# Patient Record
Sex: Female | Born: 1950 | Race: White | Hispanic: No | Marital: Married | Smoking: Current every day smoker
Health system: Southern US, Community
[De-identification: ages and names within clinical notes are randomized; demographics above are authoritative.]

## PROBLEM LIST (undated history)

## (undated) DIAGNOSIS — I10 Essential (primary) hypertension: Secondary | ICD-10-CM

## (undated) DIAGNOSIS — G629 Polyneuropathy, unspecified: Secondary | ICD-10-CM

## (undated) DIAGNOSIS — F1011 Alcohol abuse, in remission: Secondary | ICD-10-CM

## (undated) DIAGNOSIS — J189 Pneumonia, unspecified organism: Secondary | ICD-10-CM

## (undated) DIAGNOSIS — G2581 Restless legs syndrome: Secondary | ICD-10-CM

## (undated) HISTORY — PX: ABDOMINAL HYSTERECTOMY: SHX81

---

## 1997-12-31 ENCOUNTER — Ambulatory Visit (HOSPITAL_COMMUNITY): Admission: RE | Admit: 1997-12-31 | Discharge: 1997-12-31 | Payer: Self-pay | Admitting: Family Medicine

## 1999-09-14 ENCOUNTER — Emergency Department (HOSPITAL_COMMUNITY): Admission: EM | Admit: 1999-09-14 | Discharge: 1999-09-14 | Payer: Self-pay | Admitting: Emergency Medicine

## 1999-09-14 ENCOUNTER — Encounter: Payer: Self-pay | Admitting: Internal Medicine

## 1999-09-20 ENCOUNTER — Ambulatory Visit (HOSPITAL_COMMUNITY): Admission: RE | Admit: 1999-09-20 | Discharge: 1999-09-20 | Payer: Self-pay | Admitting: Orthopedic Surgery

## 1999-09-20 ENCOUNTER — Encounter: Payer: Self-pay | Admitting: Orthopedic Surgery

## 2005-08-31 ENCOUNTER — Ambulatory Visit: Payer: Self-pay | Admitting: Pulmonary Disease

## 2005-08-31 ENCOUNTER — Inpatient Hospital Stay (HOSPITAL_COMMUNITY): Admission: EM | Admit: 2005-08-31 | Discharge: 2005-09-15 | Payer: Self-pay | Admitting: Emergency Medicine

## 2005-09-01 ENCOUNTER — Encounter (INDEPENDENT_AMBULATORY_CARE_PROVIDER_SITE_OTHER): Payer: Self-pay | Admitting: Cardiology

## 2005-09-04 ENCOUNTER — Encounter (INDEPENDENT_AMBULATORY_CARE_PROVIDER_SITE_OTHER): Payer: Self-pay | Admitting: Cardiology

## 2005-09-06 ENCOUNTER — Encounter (INDEPENDENT_AMBULATORY_CARE_PROVIDER_SITE_OTHER): Payer: Self-pay | Admitting: Cardiology

## 2009-07-30 ENCOUNTER — Encounter: Admission: RE | Admit: 2009-07-30 | Discharge: 2009-07-30 | Payer: Self-pay | Admitting: Family Medicine

## 2009-09-01 ENCOUNTER — Ambulatory Visit: Payer: Self-pay | Admitting: Vascular Surgery

## 2009-12-22 ENCOUNTER — Encounter: Admission: RE | Admit: 2009-12-22 | Discharge: 2010-01-21 | Payer: Self-pay | Admitting: Family Medicine

## 2010-04-13 ENCOUNTER — Ambulatory Visit: Payer: Self-pay | Admitting: Vascular Surgery

## 2010-09-06 NOTE — Assessment & Plan Note (Signed)
OFFICE VISIT   SYLVA, OVERLEY  DOB:  October 06, 1950                                       09/01/2009  CHART#:[id number]   The patient is a 60 year old female referred by Dr. Docia Chuck for  evaluation of asymptomatic carotid stenosis.  The patient was noted to  have an asymptomatic bruit on recent physical exam and was sent for a  carotid duplex scan.  I reviewed her carotid duplex scan today.  This  was done at [facility] on 07/30/2009.  This showed a 60%-80%  right internal carotid artery stenosis and a 40%-60% left internal  carotid artery stenosis.  She had antegrade vertebral flow bilaterally.  She denies any prior symptoms of TIA, amaurosis or stroke.   CHRONIC MEDICAL PROBLEMS:  Include hypertension, elevated cholesterol,  COPD, which are all followed by her primary care doctor and currently  controlled.  She currently smokes one pack of cigarettes per day and  greater than 3 minutes today were spent regarding smoking cessation  counseling.  Past medical history is also significant for peripheral  neuropathy.   PAST SURGICAL HISTORY:  She has had tonsillectomy, right knee operation,  eye operation and hysterectomy.   FAMILY HISTORY:  Her mother had vascular disease at age less than 37.   SOCIAL HISTORY:  She is single.  She is currently on Social Security  disability.  Smoking history as listed above.  She does not consume  alcohol regularly.   REVIEW OF SYSTEMS:  A full 12 point review of systems was performed with  the patient today.  Please see intake referral form for details  regarding this.   CURRENT MEDICATIONS:  1. Include gabapentin 600 mg 2 tablets three times a day.  2. Cymbalta 60 mg once a day.  3. Bupropion 550 mg twice a day.  4. Simvastatin 20 mg once a day.  5. Singulair once in the evening.  6. Premarin 0.45 once a day.  7. Diovan/HCT once a day.   ALLERGIES:  She has allergies listed to codeine and Darvon both of  which  cause nausea and vomiting.   PHYSICAL EXAM:  Vital signs:  Blood pressure is 109/65 in the left arm,  95/62 in the right arm, heart rate is 85 and regular, respirations 16.  HEENT:  Unremarkable.  Neck:  Has 2+ carotid pulses with a soft  bilateral carotid bruit.  Chest:  Clear to auscultation.  Cardiac:  Regular rate and rhythm without murmur.  Abdomen:  Soft, nontender,  nondistended.  No masses.  Extremities:  She has absent brachial and  radial pulse on the right side.  She has a 1+ left brachial pulse with  absent left radial pulse.  She has a 2+ right femoral pulse.  She has a  barely palpable left femoral pulse.  She has a 1+ dorsalis pedis pulse  on the right.  She has absent popliteal and pedal pulses on the left.  Feet are pink, warm and well-perfused bilaterally.  Skin:  Has no open  ulcers or rashes.  Neurologic:  Exam shows no focal weakness in the  upper or lower extremities.  She does have increased hyper sensation in  both feet secondary to her neuropathy.   In summary, the patient has an asymptomatic probably 70% right internal  carotid artery stenosis.  I believe the best  option for this currently  is smoking cessation as well as starting one aspirin daily and including  continued monitoring of her other atherosclerotic risk factors of  elevated cholesterol and hypertension.  She will return for a carotid  duplex scan in six months' time or sooner if she develops symptoms  related to her carotid stenosis.  She most likely also has occlusive  disease of her lower extremities but this is currently asymptomatic.  Will continue to follow this for now.  She probably also has some  subclavian artery occlusive disease and I will make sure that blood  pressure is measured in her left arm since the pressure is higher on  that side.  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     Janetta Hora.  Fields, MD  Electronically Signed   CEF/MEDQ  D:  09/01/2009  T:  09/02/2009  Job:  [id number]   cc:   Darrow Bussing, MD

## 2010-09-09 NOTE — H&P (Signed)
NAME:  Taylor Fletcher, Taylor Fletcher             ACCOUNT NO.:  [id number]   MEDICAL RECORD NO.:  [id number]          PATIENT TYPE:  EMS   LOCATION:  ED                           FACILITY:  [facility]   PHYSICIAN:  Deirdre Peer. Polite, M.D. DATE OF BIRTH:  [birth year]   DATE OF ADMISSION:  08/31/2005  DATE OF DISCHARGE:                                HISTORY & PHYSICAL   CHIEF COMPLAINT:  Nausea, vomiting, diarrhea.   HISTORY OF PRESENT ILLNESS:  This 60 year old female has a known history of  hypertension, EtOH use to excess and tobacco abuse.  She presents to the ED  after having a 7-8 day history of nausea, vomiting, and diarrhea.  The  patient states that she has been feeling progressively weak without energy  and unable to keep anything down.  Because of those symptoms she presented  to the ED.  In the ED she was initially evaluated, found to be afebrile and  hypotensive with a BP of 62/28, pulse 89, respiratory rate of 16.  Labs were  ordered which showed hyponatremia, hypokalemia, mild elevated LFTs,  leukocytosis of 13,000; and a mildly elevated lipase at 69.  Hospice was  called for further evaluation.  In the interim the patient was given IV  fluids.  At the time of my arrival the patient was found to be very  lethargic and requesting of go home.  She denied any symptoms above, nausea,  vomiting, diarrhea, denied any bleeding, denied any sick contacts, denied  any well water, denied any recent antibiotics.  States no new medications.  The patient states that she had not drank alcohol in a couple of weeks  because of the above symptoms.  The patient denied anything necessary for  further evaluation and treatment of hypotension.   MEDICATIONS ON ADMISSION:  1.  Tylenol p.r.n.  2.  Atenolol.  3.  Xanax.   SOCIAL HISTORY:  Positive for tobacco 1 pack a day.  Positive for alcohol  states that she has not had any over the last couple of weeks, but typically  drank up to 6 beers a day.  Denies any  drugs.   ALLERGIES:  Allergic to DARVOCET.   PAST SURGICAL HISTORY:  Admits to having a hysterectomy.   FAMILY HISTORY:  Father deceased of unknown primary.  Mother with  cardiomyopathy.   REVIEW OF SYSTEMS:  As stated in the HPI.   PHYSICAL EXAMINATION:  GENERAL:  The patient is __________.  HEENT:  Moist oral mucosa, no nodes.  No JVD.  CHEST:  Moderate air movement, anterior.  CARDIOVASCULAR:  Regular without S3.  ABDOMEN:  Soft, nontender.  EXTREMITIES:  2+ pulse, the patient does have petechiae over the lower  extremities.  NEUROLOGIC:  Other than being somewhat groggy and lethargic essentially  nonfocal.   LABORATORY DATA:  CMET sodium 123, potassium 3.1, chloride 90, and lipase  69.  CBC:  White count 13.6, hemoglobin 14.3, hematocrit 40, MCV 96.8,  platelet 123.  Neutrophil count 96%.  INR 1.2.   ASSESSMENT:  1.  Hypotension following diarrhea.  2.  History of EtOH use to excess,  according to the patient none x2 weeks.  3.  History of hypertension.  4.  Anxiety.  5.  Fibroid uterus.  6.  Tobacco use, 1 pack per day.  7.  Leukocytosis.  8.  Petechia of the lower extremities.  9.  Hyponatremia.  10. Acute renal failure.  11. Mildly elevated liver function tests.  12. Hypokalemia.   RECOMMENDATIONS:  The patient be admitted to intensive care and for further  evaluation of Cipro and Flagyl for presumed GI source.  We will obtain a  urine drug screen as well as an alcohol level.  With the patient's history  of EtOH use to excess will also provide multivitamin, thiamin, and folate.  We will check a baseline cortisol level.  We will replete her potassium and  obtain a serum magnesium.  The patient will have significant followup labs,  i.e. BMET, and a CBC.  It is expected that the patient will have probably a  significant drop in her postsignificant fluid resuscitation; and will order  the necessary studies.  As for the patient's renal failure probably it is  prerenal  in etiology, further studies pending followup BMET.      Deirdre Peer. Polite, M.D.  Electronically Signed     RDP/MEDQ  D:  08/31/2005  T:  08/31/2005  Job:  [id number]

## 2010-09-09 NOTE — H&P (Signed)
NAME:  Taylor Fletcher, Taylor Fletcher             ACCOUNT NO.:  [id number]   MEDICAL RECORD NO.:  [id number]          PATIENT TYPE:  INP   LOCATION:  1603                         FACILITY:  [facility]   PHYSICIAN:  Deirdre Peer. Polite, M.D. DATE OF BIRTH:  [birth year]   DATE OF ADMISSION:  08/31/2005  DATE OF DISCHARGE:                                HISTORY & PHYSICAL   PRIMARY CARE PHYSICIAN:  Schuyler Amor, M.D.   DISCHARGE DIAGNOSES:  1.  Status post ventilator-dependent respiratory failure for right upper      lobe pneumonia, questionable early fibroproliferative acute respiratory      distress syndrome.  Antibiotic course completed.  Currently on a steroid      wean.  2.  Hypotension secondary to sepsis, resolved.  3.  Ischemic left hand, second digit, rule out vascular anomaly.  Please      note, all studies for vasculitis have been negative.  Patient requires      vascular surgeon evaluation; however, the patient is leaving AMA.  4.  Psychiatric disorder, probable depression.  Patient has been cleared by      psychiatry and states that she has informed consent.  Request has been      made, as the patient is still felt to be high risk for self neglect      secondary to polysubstance abuse, i.e. alcohol and tobacco.  5.  Acute renal failure, resolved.  6.  Bilateral pleural effusion.  7.  Small pericardial effusion, no tamponade.  Seen in consultation by      cardiology.  A 2D echo with preserved EF.  8.  Hypokalemia, resolved.  9.  Intertriginous rash.  10. Diarrhea, resolved.  Clostridium difficile negative x2.  11. History of alcohol abuse.  Patient has been provided information on AA.  12. Partial small bowel obstruction, resolved.   DISCHARGE MEDICATIONS:  1.  Zantac 150 mg b.i.d.  2.  Prednisone 40 mg daily x3 days, then 30 mg daily x3 days, then 20 mg      daily x3 days, then 10 mg daily x3 days, then stop.  3.  Lopressor 25 mg p.o. b.i.d.  4.  Xanax p.r.n.  5.  Combivent 2 puffs  q.6h.  6.  KCL 40 mEq daily.  7.  Lasix 20 mg daily.   Patient is asked not to use alcohol or smoke.   Patient is currently leaving against medical advice.  Patient is asked to  follow up with primary MD.  Essentially needs vascular evaluation for  ischemic-appearing digit on the left hand, second digit.  Please note the  patient has been seen by psychiatry prior to discharge and states that the  patient does have informed consent.   STUDIES:  C. diff negative x2.  Blood culture negative x4.  Sputum culture,  a few yeast.  Urine culture negative.  Urine Legionella antigen negative.   Chest x-ray on May 10th, no apparent disease.  Chest x-ray on May 11th, no  apparent disease.  Chest x-ray on May 12th, right upper lobe pneumonia.   V/Q scan indeterminate for PE.  Chest x-ray on May 13th, increased right upper lobe infiltrate.   CT of the abdomen on May 13th, dilated small bowel loops, mid aspect of the  abdomen.  Etiology undetermined.  Question if this was maybe adhesion  causing partial small bowel obstruction.  Followup will be necessary to  evaluate this finding.   CT of the pelvis, free fluid within the pelvis.  This may be related to  small bowel abnormality.  Patient may be at risk for ischemic bowel.  Clinical correlation and close followup recommended.   Chest x-ray on the 15th showed __________ tip in good position.  No  pneumothorax.  Chest x-ray on the 16th, persistent upper lobe opacities  bilaterally.   Abdominal series on the 17th, no suggestion of small bowel obstruction.   Chest x-ray on the 18th, progressive volume loss, left base.  Chest x-ray on  the 19th, integral increasing effusion and mild edema, increased left  basilar atelectasis/consolidation.  Chest x-ray on the 21st, slight interval  improvement in left basilar aeration.  Chest x-ray on the 23rd, integral  __________ removal of NG tube, increasing interstitial pulmonary edema,  right PICC line  tip __________.   ANA, ANCA, rheumatoid factor, all negative.  Sed rate elevated.  TSH 0.8,  cortisol level 47.9.  Urine drug screen positive for benzodiazepines.  Lactic acid 2.3.   Echo:  Vigorous LV function, positive pericardial effusion.   Albumin 1.9.  CBC at discharge:  White count 9.1, hemoglobin 10.6, platelets  359, sed rate 61.  BMET within normal limits except for a potassium of 3.  This was on May 23.  Follow-up BMET on May 24th, 3.2.  Follow-up BMET on May  25th, potassium 3.8.   A 2D echo:  Overall LV systolic function was normal.  LVEF had estimated  range of 55-60% with diagnostic evidence of LV regional wall motion  abnormality.  There were mild fibrocalcific changes of the aortic root.  The  right ventricular was mildly dilated.  Estimated peak right ventricular  systolic pressure was mildly increased.  There was a branch of a catheter or  a pacer wire in the right ventricle.  The right atrium was mildly dilated.  Inferior vena cava was mildly dilated.  There appeared to be a small, fibrin-  stranded pericardial effusion circumferential to the heart.  Infusion  dimensions 5 mm anterior, 5 mm posterior with no significant change from  previous study.   HISTORY OF PRESENT ILLNESS:  This is a 60 year old female who presented to  the ED for evaluation.  The patient had been having profuse nausea,  vomiting, and diarrhea.  In the ED, the patient was evaluated and found to  be hypotensive.  Labs showing leukocytosis.  [facility] were  consulted for further evaluation.  At the time of my evaluation, the patient  was felt to be probably obstructed from a GI source, and admission was  deemed necessary for further evaluation and treatment.  Please see dictated  H&P for further details.   PAST MEDICAL HISTORY:  Only significant for hypertension and daily alcohol  use as well as tobacco use.  MEDICATIONS ON ADMISSION:  Atenolol and Xanax.   SOCIAL HISTORY:   Positive for alcohol.  Positive for tobacco.  No drugs.   ALLERGIES:  Reported allergy to DARVOCET.   PAST SURGICAL HISTORY:  Admits to having a hysterectomy.   FAMILY HISTORY:  Noncontributory.   HOSPITAL COURSE:  1.  Patient was admitted to ICU setting  for further evaluation and treatment      of her sepsis presentation.  Please note the patient on admission      described having nausea, vomiting, and diarrhea, which essentially      resolved while in the hospital.  The patient was started on empiric      antibiotics from a potential GI source, which included Cipro and Flagyl.      The patient was adequately volume-resuscitated.  Despite the above      measures, the patient continued to remain hypotensive.  Differential      diagnosis was broadened.  Concern for probable cardiac cause was      entertained.  Patient was continued with aggressive fluid resuscitation.      Pressors were ordered.  A 2D echo was ordered.  Several consultants were      seen as well.  Patient saw her cardiologist and ultimately, pulmonary      critical care medicine.  From a cardiac standpoint, it was felt that the      patient's effusion was not significant enough to be causing her      hypotension; however, the patient continued to have follow-up echos if      this remained a concern in the back of her mind.  The patient's follow-      up chest x-rays ultimately showed a right upper lobe pneumonia and from      that point, the patient progressed to vent-dependent respiratory      failure.  Patient was intubated electively, as it was seen that she was      tiring out.  Patient was on the pulmonary critical care from May 13      until May 24.  After intubation, the patient had several central lines,      Swan-Ganz catheters.  The patient was placed on a sepsis protocol and      treated with Xigris.  The patient continued with pressor support,      dopamine, Levophed, which was ultimately weaned.  It still  remained a      conundrum the duration of her hospitalization why her hypotension      persisted.  More than likely, it was multifactorial secondary to      decreased volume, may be contributory secondary to the pleural effusion,      +/- sepsis secondary to the pneumonia.  However, the overall impression      of the critical care doctors were perplexing as to why she continued to      have hypotension, as her CVP obviously was elevated.  Patient's      antibiotics were streamlined.  Flagyl was DC'd.  Vancomycin was DC'd.      Zosyn was continued.  Ultimately, the patient's x-rays have shown some      component of ARDS, and there was concern for early fibroproliferative      ARDS, and Dr. Sherene Sires started the patient on Solu-Medrol, which was      ultimately weaned to oral prednisone.  The patient was extubated on May      22 and ultimately transferred to a hospitalist service.  By that time,     the patient had completed her antibiotics and still was on oral steroid      taper.  Immediately after being extubated, the patient began to state      that she needed to leave the hospital, despite knowing that she was  sick.  Of note, the patient had to be committed to the hospital two days      after admission because she stated that she wanted to leave.  At that      time, she was hypotensive and on pressors; therefore, it was felt      prudent to have psychiatry to see the patient again.  On May 25, patient      was seen by Dr. Jeanie Sewer.  It was stated that the patient had informed      consent; however, despite being cleared by psych, I still feel that the      patient is too sick to be leaving the hospital.  She has several      unresolved issues; therefore, she is being asked to sign an AMA form.  2.  As for the patient's pneumonia, she is to complete her antibiotic course      and will continue nebs as well as steroid taper.  3.  Acute renal failure:  Completely resolved secondary to  decreased volume.  4.  Gastroenteritis, resolved.  5.  Partial small bowel obstruction, resolved.  6.  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  All studies for      vasculitis have been negative to date.  With the patients, smoking      history, she may have some vascular problem as a result of that.  It is      felt that the patient still needs to follow up with a vascular surgeon      on an outpatient basis; however, as the patient is leaving AMA, this      cannot be arranged.  7.  Hypotension, resolved.  Currently, the patient is being placed back on      her antihypertensive medications.  8.  Intertriginous rash:  Patient will continue to require nystatin powder      in the groin area to complete resolution.  9.  Ethanol abuse:  Recommended to patient to seek outside assistance.      Again, the patient is leaving AMA; therefore, this cannot be arranged.   Patient has several other chronic medical problems and again, patient is  leaving against medical advice.  The main issue that needs to be evaluated  at this time is the patient's ischemic digit, second digit on the left hand.  The tip is fairly necrotic and ischemic in appearance.      Deirdre Peer. Polite, M.D.  Electronically Signed     RDP/MEDQ  D:  09/15/2005  T:  09/15/2005  Job:  [id number]   cc:   Schuyler Amor, M.D.  Fax: [phone]

## 2010-10-31 ENCOUNTER — Observation Stay (HOSPITAL_COMMUNITY)
Admission: EM | Admit: 2010-10-31 | Discharge: 2010-11-01 | DRG: 206 | Discharge status: Against Medical Advice | Payer: Medicaid Other | Attending: Internal Medicine | Admitting: Internal Medicine

## 2010-10-31 ENCOUNTER — Emergency Department (HOSPITAL_COMMUNITY): Payer: Medicaid Other

## 2010-10-31 DIAGNOSIS — R079 Chest pain, unspecified: Principal | ICD-10-CM | POA: Insufficient documentation

## 2010-10-31 DIAGNOSIS — D72829 Elevated white blood cell count, unspecified: Secondary | ICD-10-CM | POA: Insufficient documentation

## 2010-10-31 DIAGNOSIS — F17200 Nicotine dependence, unspecified, uncomplicated: Secondary | ICD-10-CM | POA: Insufficient documentation

## 2010-10-31 DIAGNOSIS — Z90710 Acquired absence of both cervix and uterus: Secondary | ICD-10-CM | POA: Insufficient documentation

## 2010-10-31 DIAGNOSIS — I10 Essential (primary) hypertension: Secondary | ICD-10-CM | POA: Insufficient documentation

## 2010-10-31 DIAGNOSIS — Z9089 Acquired absence of other organs: Secondary | ICD-10-CM | POA: Insufficient documentation

## 2010-10-31 DIAGNOSIS — R110 Nausea: Secondary | ICD-10-CM | POA: Insufficient documentation

## 2010-10-31 DIAGNOSIS — R109 Unspecified abdominal pain: Secondary | ICD-10-CM | POA: Insufficient documentation

## 2010-10-31 LAB — LIPASE, BLOOD: Lipase: 20 U/L (ref 11–59)

## 2010-10-31 LAB — DIFFERENTIAL
Basophils Absolute: 0 10*3/uL (ref 0.0–0.1)
Basophils Relative: 0 % (ref 0–1)
Monocytes Absolute: 0.5 10*3/uL (ref 0.1–1.0)
Neutro Abs: 14.3 10*3/uL — ABNORMAL HIGH (ref 1.7–7.7)

## 2010-10-31 LAB — URINALYSIS, ROUTINE W REFLEX MICROSCOPIC
Nitrite: NEGATIVE
Protein, ur: >300 mg/dL — AB
Urobilinogen, UA: 0.2 mg/dL (ref 0.0–1.0)

## 2010-10-31 LAB — CBC
Hemoglobin: 13.6 g/dL (ref 12.0–15.0)
MCHC: 33.7 g/dL (ref 30.0–36.0)
RDW: 13.7 % (ref 11.5–15.5)
WBC: 16.5 10*3/uL — ABNORMAL HIGH (ref 4.0–10.5)

## 2010-10-31 LAB — COMPREHENSIVE METABOLIC PANEL
ALT: 10 U/L (ref 0–35)
Albumin: 3.7 g/dL (ref 3.5–5.2)
Alkaline Phosphatase: 64 U/L (ref 39–117)
Chloride: 100 mEq/L (ref 96–112)
Glucose, Bld: 130 mg/dL — ABNORMAL HIGH (ref 70–99)
Potassium: 3.6 mEq/L (ref 3.5–5.1)
Sodium: 133 mEq/L — ABNORMAL LOW (ref 135–145)
Total Protein: 7.2 g/dL (ref 6.0–8.3)

## 2010-10-31 LAB — TROPONIN I: Troponin I: <0.3 ng/mL (ref <0.30)

## 2010-10-31 LAB — CK TOTAL AND CKMB (NOT AT ARMC)
CK, MB: 2.5 ng/mL (ref 0.3–4.0)
Relative Index: INVALID (ref 0.0–2.5)

## 2010-10-31 LAB — URINE MICROSCOPIC-ADD ON

## 2010-10-31 MED ORDER — IOHEXOL 300 MG/ML  SOLN
100.0000 mL | Freq: Once | INTRAMUSCULAR | Status: AC | PRN
  Administered 2010-10-31: 100 mL via INTRAVENOUS

## 2010-11-01 LAB — CARDIAC PANEL(CRET KIN+CKTOT+MB+TROPI)
CK, MB: 2.4 ng/mL (ref 0.3–4.0)
Relative Index: INVALID (ref 0.0–2.5)
Total CK: 60 U/L (ref 7–177)
Total CK: 59 U/L (ref 7–177)

## 2010-11-01 LAB — COMPREHENSIVE METABOLIC PANEL
Alkaline Phosphatase: 64 U/L (ref 39–117)
BUN: 16 mg/dL (ref 6–23)
GFR calc Af Amer: >60 mL/min (ref >60)
Glucose, Bld: 98 mg/dL (ref 70–99)
Potassium: 3.5 mEq/L (ref 3.5–5.1)
Total Protein: 7.1 g/dL (ref 6.0–8.3)

## 2010-11-01 LAB — DIFFERENTIAL
Basophils Absolute: 0 10*3/uL (ref 0.0–0.1)
Basophils Relative: 0 % (ref 0–1)
Eosinophils Absolute: 0 10*3/uL (ref 0.0–0.7)
Eosinophils Relative: 0 % (ref 0–5)
Monocytes Absolute: 1.7 10*3/uL — ABNORMAL HIGH (ref 0.1–1.0)

## 2010-11-01 LAB — CBC
MCHC: 33.8 g/dL (ref 30.0–36.0)
RDW: 13.8 % (ref 11.5–15.5)

## 2010-11-02 LAB — URINE CULTURE
Colony Count: NO GROWTH
Culture: NO GROWTH

## 2010-11-03 NOTE — Discharge Summary (Signed)
  NAMEJACARRA, Taylor Fletcher             ACCOUNT NO.:  [id number]  MEDICAL RECORD NO.:  [id number]  LOCATION:  1432                         FACILITY:  [facility]  PHYSICIAN:  Conley Canal, MD      DATE OF BIRTH:  04-16-1951  DATE OF ADMISSION:  10/31/2010 DATE OF DISCHARGE:  11/01/2010                        DISCHARGE SUMMARY - REFERRING   PRIMARY CARE PHYSICIAN:  [facility].  DISCHARGE DIAGNOSES: 1. Leukocytosis, unclear etiology, possibility of abdominal infection. 2. Chest pain, probably costochondritis. 3. Tobacco habituation. 4. Hypertension. 5. History of alcohol abuse. 6. Status post appendectomy. 7. Status post hysterectomy.  DISCHARGE MEDICATIONS:  Patient is to continue her previous home medications.  PROCEDURES PERFORMED:  CT abdomen and pelvis on October 31, 2010 showed no evidence of bowel obstruction and status post hysterectomy with 5.2 cm midline pelvic fluid collection possibly reflecting a peritoneal inclusion cyst.  HOSPITAL COURSE:  [name] was admitted on October 31, 2010 with complaints of abdominal pain, chest pains associated with some chills and some nausea.  At admission, there was concern for acute coronary syndrome and for possible abdominal pathology, hence CT abdomen and pelvis, which was significant for a 5.2 cm midline fluid collection thought to be an inclusion cyst.  She was also noted to have leukocytosis of 16,000 at admission and today the white count had increased to 20,000.  She did not have fever and her cardiac enzymes were negative x3.  She seems like she may have some ongoing infection which needs further evaluation, but today patient decided to sign against medical advise although she understood the risk of leaving the hospital without being adequately evaluated and treated.  I encouraged her to follow with her primary care physician as soon as possible.     Conley Canal, MD     SR/MEDQ  D:  11/01/2010   T:  11/01/2010  Job:  [id number]  cc:   [facility]  Electronically Signed by Conley Canal  on 11/03/2010 07:30:58 PM

## 2010-12-10 NOTE — H&P (Signed)
NAME:  Taylor Fletcher, KIRSCH NO.:  [id number]  MEDICAL RECORD NO.:  [id number]  LOCATION:  [facility]                         FACILITY:  [facility]  PHYSICIAN:  Massie Maroon, MD        DATE OF BIRTH:  1950/10/29  DATE OF ADMISSION:  10/31/2010 DATE OF DISCHARGE:                             HISTORY & PHYSICAL   CHIEF COMPLAINT:  Chest pain.  HISTORY OF PRESENT ILLNESS:  A 60 year old female with history of anxiety, hypertension, restless legs syndrome who apparently is a very poor historian but apparently woke up this morning and complaining of some stomach pain as well as some nausea as well as some substernal chest pain without radiation.  She will not clearly describe the quality of the pain to me and she will not exclusively tell me when it started or how long it lasted.  She will only say that the chest pain stopped before she got here to the Emergency Room.  She continues to have the abdominal discomfort that is associated with nausea but denies any fever, chills, diarrhea, constipation or bright blood per rectum or black stool.  She will not divulge to me if she has had a prior colonoscopy or EGD in the past.  EKG shows normal sinus rhythm at 85, normal axis, normal PR interval, no ST-T segment changes consistent with acute ischemia.  Chest x-ray was negative for any acute process. Cardiac markers are negative x2.  The patient's white count was mildly elevated.  Liver function and lipase were within normal limits.  CT scan of the abdomen and pelvis showed evidence of prior hysterectomy as well as appendectomy.  There was a 5.2 cm midline pelvic fluid collection possibly reflecting a peritoneal inclusion cyst.  There was nothing to explain to her abdominal symptoms.  The patient will be admitted for workup of chest pain which I believe is atypical.  Note, her blood pressure was elevated and we will try to improve blood pressure control during the stay.  PAST MEDICAL  HISTORY: 1. Anxiety. 2. Restless legs syndrome. 3. Hypertension. 4. 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 5. Hypotension secondary to sepsis, resolved. 6. Ischemic left hand second digit, vasculitis negative. 7. Psychiatric disorder, probable depression. 8. Acute renal failure, resolved. 9. Bilateral pleural effusion, resolved. 10.Small pericardial effusion, no tamponade, on cardiac 2-D echo in     2007, preserved EF. 11.Hypokalemia, resolved. 12.Intertriginous rash, resolved. 13.Diarrhea in the past, C diff negative. 14.History of alcohol abuse. 15.Partial small-bowel obstruction, resolved.  PAST SURGICAL HISTORY:  Appendectomy and hysterectomy.  SOCIAL HISTORY:  The patient smoked cigarettes 1 pack per day for 40 years.  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  She used to drink heavily, but not now.  FAMILY HISTORY:  Mother died at age 53 of CHF and may have had heart disease as well.  Father died at age 59 of some form of cancer.  One sibling and brother died of HIV at age 3.  There is family history of heart disease.  ALLERGIES:  ? DARVOCET.  MEDICATIONS:  Unknown, the patient is unclear about her medications.  REVIEW OF SYSTEMS:  Negative for all  10-organ systems except for pertinent positives as stated above.  PHYSICAL EXAMINATION:  VITAL SIGNS:  Temperature 100.2, pulse 95, blood pressure 183/74, pulse ox 95% on room air. HEENT:  Anicteric. NECK:  No JVD. HEART:  Regular rate and rhythm.  S1 and S2.  No murmurs, gallops or rubs. LUNGS:  Clear to auscultation bilaterally. ABDOMEN:  Soft, slightly tender.  No guarding, no rebound.  Positive normoactive bowel sounds. EXTREMITIES:  No cyanosis, clubbing, or edema. SKIN:  No rashes. LYMPH NODES:  No adenopathy. NEUROLOGIC:  Nonfocal.  LABORATORY DATA:  Urinalysis, protein greater than 300, rbc's 3-6.  WBC 16.5, hemoglobin 13.6, platelet count 209.  Sodium 133, potassium  3.6. BUN 16, creatinine 0.87.  AST 16, ALT 10.  CPK 54, CK-MB 2.2, troponin I less than 0.30.  Second round of cardiac markers, CPK 61, CK-MB 2.5, troponin I less than 0.30.  ASSESSMENT/PLAN: 1. Chest pain with cardiac risk factors including tobacco use as well     as positive family history of chronic atrial fibrillation.  The     patient will be placed on telemetry.  We will cycle cardiac makers     q.6 h. x3 sets.  The patient will be placed on aspirin, Lipitor and     carvedilol.  We will also use Protonix 40 mg p.o. b.i.d. for     gastrointestinal prophylaxis.  I believe that acid reflux and     gastritis may be the cause of her symptoms.  The patient will be     placed on __________. 2. Hypertension, uncontrolled with nitro paste.  Hydralazine 10 mg IV     q.6 h. p.r.n. if systolic blood pressure greater than 150. 3. ?restless legs syndrome. 4. __________.     Massie Maroon, MD     JYK/MEDQ  D:  10/31/2010  T:  11/01/2010  Job:  [id number]  Electronically Signed by Pearson Grippe MD on 12/10/2010 04:54:09 PM

## 2014-10-06 ENCOUNTER — Ambulatory Visit: Payer: Medicaid Other | Admitting: Family Medicine

## 2015-09-09 ENCOUNTER — Emergency Department (HOSPITAL_COMMUNITY)
Admission: EM | Admit: 2015-09-09 | Discharge: 2015-09-09 | Discharge status: Against Medical Advice | Payer: Medicare Other | Attending: Dermatology | Admitting: Dermatology

## 2015-09-09 ENCOUNTER — Encounter (HOSPITAL_COMMUNITY): Payer: Self-pay | Admitting: *Deleted

## 2015-09-09 DIAGNOSIS — F10129 Alcohol abuse with intoxication, unspecified: Secondary | ICD-10-CM | POA: Diagnosis present

## 2015-09-09 DIAGNOSIS — W19XXXA Unspecified fall, initial encounter: Secondary | ICD-10-CM | POA: Diagnosis not present

## 2015-09-09 DIAGNOSIS — G629 Polyneuropathy, unspecified: Secondary | ICD-10-CM | POA: Insufficient documentation

## 2015-09-09 DIAGNOSIS — Y999 Unspecified external cause status: Secondary | ICD-10-CM | POA: Diagnosis not present

## 2015-09-09 DIAGNOSIS — M549 Dorsalgia, unspecified: Secondary | ICD-10-CM | POA: Diagnosis not present

## 2015-09-09 DIAGNOSIS — Y929 Unspecified place or not applicable: Secondary | ICD-10-CM | POA: Diagnosis not present

## 2015-09-09 DIAGNOSIS — Y9301 Activity, walking, marching and hiking: Secondary | ICD-10-CM | POA: Insufficient documentation

## 2015-09-09 HISTORY — DX: Polyneuropathy, unspecified: G62.9

## 2015-09-09 NOTE — ED Notes (Signed)
Per EMS, pt fell, was on the floor upon arrival on scene.  Pt reports dizziness x 4 days.  Drank about 3 beers today.  Was walking in the hallway today and fell.  Pt is A&O x 4.  Pt reports back pain.

## 2015-09-09 NOTE — ED Notes (Signed)
Pt told RN she wanted to leave and wants phone number for taxi. Pt informed that she is welcome to return. Pt ambulatory with no assist.

## 2016-05-12 ENCOUNTER — Emergency Department (HOSPITAL_COMMUNITY): Payer: Medicare Other

## 2016-05-12 ENCOUNTER — Inpatient Hospital Stay (HOSPITAL_COMMUNITY)
Admission: EM | Admit: 2016-05-12 | Discharge: 2016-05-25 | DRG: 557 | Disposition: E | Payer: Medicare Other | Attending: Pulmonary Disease | Admitting: Pulmonary Disease

## 2016-05-12 ENCOUNTER — Encounter (HOSPITAL_COMMUNITY): Payer: Self-pay | Admitting: Emergency Medicine

## 2016-05-12 DIAGNOSIS — Z885 Allergy status to narcotic agent status: Secondary | ICD-10-CM

## 2016-05-12 DIAGNOSIS — G2581 Restless legs syndrome: Secondary | ICD-10-CM | POA: Diagnosis present

## 2016-05-12 DIAGNOSIS — M6282 Rhabdomyolysis: Principal | ICD-10-CM | POA: Diagnosis present

## 2016-05-12 DIAGNOSIS — G9340 Encephalopathy, unspecified: Secondary | ICD-10-CM | POA: Diagnosis present

## 2016-05-12 DIAGNOSIS — E875 Hyperkalemia: Secondary | ICD-10-CM | POA: Diagnosis present

## 2016-05-12 DIAGNOSIS — N179 Acute kidney failure, unspecified: Secondary | ICD-10-CM | POA: Diagnosis not present

## 2016-05-12 DIAGNOSIS — Z886 Allergy status to analgesic agent status: Secondary | ICD-10-CM | POA: Diagnosis not present

## 2016-05-12 DIAGNOSIS — I10 Essential (primary) hypertension: Secondary | ICD-10-CM | POA: Diagnosis present

## 2016-05-12 DIAGNOSIS — Z515 Encounter for palliative care: Secondary | ICD-10-CM | POA: Diagnosis present

## 2016-05-12 DIAGNOSIS — Z66 Do not resuscitate: Secondary | ICD-10-CM | POA: Diagnosis present

## 2016-05-12 DIAGNOSIS — E162 Hypoglycemia, unspecified: Secondary | ICD-10-CM | POA: Diagnosis present

## 2016-05-12 DIAGNOSIS — T68XXXA Hypothermia, initial encounter: Secondary | ICD-10-CM

## 2016-05-12 DIAGNOSIS — E872 Acidosis, unspecified: Secondary | ICD-10-CM | POA: Diagnosis present

## 2016-05-12 DIAGNOSIS — G629 Polyneuropathy, unspecified: Secondary | ICD-10-CM | POA: Diagnosis present

## 2016-05-12 DIAGNOSIS — F17210 Nicotine dependence, cigarettes, uncomplicated: Secondary | ICD-10-CM | POA: Diagnosis present

## 2016-05-12 DIAGNOSIS — E861 Hypovolemia: Secondary | ICD-10-CM | POA: Diagnosis present

## 2016-05-12 DIAGNOSIS — Z90710 Acquired absence of both cervix and uterus: Secondary | ICD-10-CM | POA: Diagnosis not present

## 2016-05-12 DIAGNOSIS — I248 Other forms of acute ischemic heart disease: Secondary | ICD-10-CM | POA: Diagnosis present

## 2016-05-12 DIAGNOSIS — I959 Hypotension, unspecified: Secondary | ICD-10-CM | POA: Diagnosis present

## 2016-05-12 DIAGNOSIS — R4182 Altered mental status, unspecified: Secondary | ICD-10-CM | POA: Diagnosis not present

## 2016-05-12 DIAGNOSIS — R680 Hypothermia, not associated with low environmental temperature: Secondary | ICD-10-CM | POA: Diagnosis present

## 2016-05-12 DIAGNOSIS — E860 Dehydration: Secondary | ICD-10-CM | POA: Diagnosis present

## 2016-05-12 DIAGNOSIS — E871 Hypo-osmolality and hyponatremia: Secondary | ICD-10-CM | POA: Diagnosis present

## 2016-05-12 DIAGNOSIS — R230 Cyanosis: Secondary | ICD-10-CM | POA: Diagnosis present

## 2016-05-12 DIAGNOSIS — J9601 Acute respiratory failure with hypoxia: Secondary | ICD-10-CM | POA: Diagnosis present

## 2016-05-12 DIAGNOSIS — Z791 Long term (current) use of non-steroidal anti-inflammatories (NSAID): Secondary | ICD-10-CM | POA: Diagnosis not present

## 2016-05-12 HISTORY — DX: Essential (primary) hypertension: I10

## 2016-05-12 HISTORY — DX: Alcohol abuse, in remission: F10.11

## 2016-05-12 HISTORY — DX: Restless legs syndrome: G25.81

## 2016-05-12 HISTORY — DX: Pneumonia, unspecified organism: J18.9

## 2016-05-12 LAB — BLOOD GAS, ARTERIAL
ACID-BASE DEFICIT: 22.7 mmol/L — AB (ref 0.0–2.0)
Acid-base deficit: 19 mmol/L — ABNORMAL HIGH (ref 0.0–2.0)
BICARBONATE: 9.1 mmol/L — AB (ref 20.0–28.0)
BICARBONATE: 14 mmol/L — AB (ref 20.0–28.0)
DRAWN BY: 295031
Drawn by: 235321
FIO2: 100
FIO2: 100
O2 SAT: 86.9 %
O2 Saturation: 57.4 %
PCO2 ART: 77.6 mmHg — AB (ref 32.0–48.0)
PH ART: 6.876 — AB (ref 7.350–7.450)
PO2 ART: 40.6 mmHg — AB (ref 83.0–108.0)
Patient temperature: 98.6
Patient temperature: 35.9
pCO2 arterial: 45.6 mmHg (ref 32.0–48.0)
pH, Arterial: 6.931 — CL (ref 7.350–7.450)
pO2, Arterial: 78.1 mmHg — ABNORMAL LOW (ref 83.0–108.0)

## 2016-05-12 LAB — COMPREHENSIVE METABOLIC PANEL
ALBUMIN: 2.4 g/dL — AB (ref 3.5–5.0)
ALK PHOS: 60 U/L (ref 38–126)
ALT: 27 U/L (ref 14–54)
ANION GAP: 18 — AB (ref 5–15)
AST: 80 U/L — ABNORMAL HIGH (ref 15–41)
BUN: 106 mg/dL — ABNORMAL HIGH (ref 6–20)
CO2: 13 mmol/L — AB (ref 22–32)
CREATININE: 6.11 mg/dL — AB (ref 0.44–1.00)
Calcium: 7.7 mg/dL — ABNORMAL LOW (ref 8.9–10.3)
Chloride: 96 mmol/L — ABNORMAL LOW (ref 101–111)
GFR calc Af Amer: 7 mL/min — ABNORMAL LOW (ref >60)
GFR calc non Af Amer: 6 mL/min — ABNORMAL LOW (ref >60)
GLUCOSE: 479 mg/dL — AB (ref 65–99)
Potassium: 6 mmol/L — ABNORMAL HIGH (ref 3.5–5.1)
SODIUM: 127 mmol/L — AB (ref 135–145)
Total Bilirubin: 0.7 mg/dL (ref 0.3–1.2)
Total Protein: 6.2 g/dL — ABNORMAL LOW (ref 6.5–8.1)

## 2016-05-12 LAB — CBG MONITORING, ED
GLUCOSE-CAPILLARY: 471 mg/dL — AB (ref 65–99)
Glucose-Capillary: 118 mg/dL — ABNORMAL HIGH (ref 65–99)
Glucose-Capillary: <10 mg/dL — CL (ref 65–99)
Glucose-Capillary: 316 mg/dL — ABNORMAL HIGH (ref 65–99)
Glucose-Capillary: 227 mg/dL — ABNORMAL HIGH (ref 65–99)

## 2016-05-12 LAB — URINALYSIS, ROUTINE W REFLEX MICROSCOPIC
Bilirubin Urine: NEGATIVE
GLUCOSE, UA: NEGATIVE mg/dL
HGB URINE DIPSTICK: NEGATIVE
Ketones, ur: NEGATIVE mg/dL
Leukocytes, UA: NEGATIVE
NITRITE: NEGATIVE
PH: 5 (ref 5.0–8.0)
Protein, ur: 30 mg/dL — AB
SPECIFIC GRAVITY, URINE: 1.021 (ref 1.005–1.030)
WBC UA: NONE SEEN WBC/hpf (ref 0–5)

## 2016-05-12 LAB — LIPASE, BLOOD: Lipase: 48 U/L (ref 11–51)

## 2016-05-12 LAB — CBC WITH DIFFERENTIAL/PLATELET
BASOS PCT: 0 %
Basophils Absolute: 0 10*3/uL (ref 0.0–0.1)
Eosinophils Absolute: 0 10*3/uL (ref 0.0–0.7)
Eosinophils Relative: 0 %
HEMATOCRIT: 32.1 % — AB (ref 36.0–46.0)
HEMOGLOBIN: 11 g/dL — AB (ref 12.0–15.0)
LYMPHS PCT: 12 %
Lymphs Abs: 0.7 10*3/uL (ref 0.7–4.0)
MCH: 31.1 pg (ref 26.0–34.0)
MCHC: 34.3 g/dL (ref 30.0–36.0)
MCV: 90.7 fL (ref 78.0–100.0)
Monocytes Absolute: 0.2 10*3/uL (ref 0.1–1.0)
Monocytes Relative: 4 %
NRBC: 11 /100{WBCs} — AB
Neutro Abs: 4.8 10*3/uL (ref 1.7–7.7)
Neutrophils Relative %: 84 %
Platelets: 221 10*3/uL (ref 150–400)
RBC: 3.54 MIL/uL — ABNORMAL LOW (ref 3.87–5.11)
RDW: 14.1 % (ref 11.5–15.5)
WBC Morphology: INCREASED
WBC: 5.7 10*3/uL (ref 4.0–10.5)

## 2016-05-12 LAB — RAPID URINE DRUG SCREEN, HOSP PERFORMED
Amphetamines: NOT DETECTED
BARBITURATES: POSITIVE — AB
Benzodiazepines: POSITIVE — AB
COCAINE: NOT DETECTED
OPIATES: NOT DETECTED
Tetrahydrocannabinol: NOT DETECTED

## 2016-05-12 LAB — I-STAT TROPONIN, ED: Troponin i, poc: 1.15 ng/mL (ref 0.00–0.08)

## 2016-05-12 LAB — ETHANOL: Alcohol, Ethyl (B): <5 mg/dL (ref <5)

## 2016-05-12 LAB — CK: CK TOTAL: 1533 U/L — AB (ref 38–234)

## 2016-05-12 LAB — CBC
HEMATOCRIT: 14.9 % — AB (ref 36.0–46.0)
Hemoglobin: 5.2 g/dL — CL (ref 12.0–15.0)
MCH: 31.5 pg (ref 26.0–34.0)
MCHC: 34.9 g/dL (ref 30.0–36.0)
MCV: 90.3 fL (ref 78.0–100.0)
Platelets: 91 10*3/uL — ABNORMAL LOW (ref 150–400)
RBC: 1.65 MIL/uL — ABNORMAL LOW (ref 3.87–5.11)
RDW: 14 % (ref 11.5–15.5)
WBC: 2.7 10*3/uL — AB (ref 4.0–10.5)

## 2016-05-12 LAB — MRSA PCR SCREENING: MRSA by PCR: NEGATIVE

## 2016-05-12 LAB — AMMONIA: AMMONIA: 39 umol/L — AB (ref 9–35)

## 2016-05-12 LAB — PROTIME-INR
INR: 1.31
PROTHROMBIN TIME: 16.4 s — AB (ref 11.4–15.2)

## 2016-05-12 LAB — I-STAT CG4 LACTIC ACID, ED: Lactic Acid, Venous: 4.31 mmol/L (ref 0.5–1.9)

## 2016-05-12 LAB — ACETAMINOPHEN LEVEL

## 2016-05-12 LAB — TSH: TSH: 0.224 u[IU]/mL — ABNORMAL LOW (ref 0.350–4.500)

## 2016-05-12 LAB — APTT: APTT: 40 s — AB (ref 24–36)

## 2016-05-12 LAB — SALICYLATE LEVEL: Salicylate Lvl: <7 mg/dL (ref 2.8–30.0)

## 2016-05-12 MED ORDER — DEXTROSE 10 % IV SOLN: INTRAVENOUS | Status: DC

## 2016-05-12 MED ORDER — VANCOMYCIN HCL IN DEXTROSE 1-5 GM/200ML-% IV SOLN
1000.0000 mg | Freq: Once | INTRAVENOUS | Status: AC
  Administered 2016-05-12: 1000 mg via INTRAVENOUS
  Filled 2016-05-12: qty 200

## 2016-05-12 MED ORDER — DEXMEDETOMIDINE HCL IN NACL 200 MCG/50ML IV SOLN: 0.4000 ug/kg/h | INTRAVENOUS | Status: DC

## 2016-05-12 MED ORDER — DEXTROSE 5 % IV SOLN
INTRAVENOUS | Status: DC
  Administered 2016-05-12: 19:00:00 via INTRAVENOUS
  Filled 2016-05-12: qty 100

## 2016-05-12 MED ORDER — SODIUM CHLORIDE 0.9 % IV BOLUS (SEPSIS)
1000.0000 mL | Freq: Once | INTRAVENOUS | Status: AC
  Administered 2016-05-12: 1000 mL via INTRAVENOUS

## 2016-05-12 MED ORDER — DEXTROSE 10 % IV SOLN
INTRAVENOUS | Status: DC
  Filled 2016-05-12: qty 1000

## 2016-05-12 MED ORDER — PANTOPRAZOLE SODIUM 40 MG IV SOLR
40.0000 mg | INTRAVENOUS | Status: DC
  Administered 2016-05-12: 40 mg via INTRAVENOUS
  Filled 2016-05-12: qty 40

## 2016-05-12 MED ORDER — DEXTROSE 50 % IV SOLN
INTRAVENOUS | Status: AC
  Administered 2016-05-12: 14:00:00
  Filled 2016-05-12: qty 50

## 2016-05-12 MED ORDER — PIPERACILLIN-TAZOBACTAM IN DEX 2-0.25 GM/50ML IV SOLN
2.2500 g | Freq: Three times a day (TID) | INTRAVENOUS | Status: DC
  Filled 2016-05-12 (×2): qty 50

## 2016-05-12 MED ORDER — SODIUM CHLORIDE 0.9 % IV SOLN
250.0000 mL | INTRAVENOUS | Status: DC | PRN
Start: 2016-05-12 — End: 2016-05-13

## 2016-05-12 MED ORDER — LORAZEPAM 2 MG/ML IJ SOLN: 1.0000 mg | INTRAMUSCULAR | Status: DC | PRN

## 2016-05-12 MED ORDER — FOLIC ACID 1 MG PO TABS: 1.0000 mg | ORAL_TABLET | Freq: Every day | ORAL | Status: DC

## 2016-05-12 MED ORDER — INSULIN REGULAR BOLUS VIA INFUSION: 10.0000 [IU] | Freq: Once | INTRAVENOUS | Status: DC

## 2016-05-12 MED ORDER — HEPARIN SODIUM (PORCINE) 5000 UNIT/ML IJ SOLN: 5000.0000 [IU] | Freq: Three times a day (TID) | INTRAMUSCULAR | Status: DC

## 2016-05-12 MED ORDER — SODIUM CHLORIDE 0.9 % IV SOLN
Freq: Once | INTRAVENOUS | Status: AC
  Administered 2016-05-12: 19:00:00 via INTRAVENOUS

## 2016-05-12 MED ORDER — SODIUM BICARBONATE 8.4 % IV SOLN
100.0000 meq | Freq: Once | INTRAVENOUS | Status: AC
  Administered 2016-05-12: 100 meq via INTRAVENOUS
  Filled 2016-05-12: qty 50
  Filled 2016-05-12: qty 100

## 2016-05-12 MED ORDER — DEXTROSE 50 % IV SOLN
1.0000 | Freq: Once | INTRAVENOUS | Status: AC
  Administered 2016-05-12: 50 mL via INTRAVENOUS
  Filled 2016-05-12: qty 50

## 2016-05-12 MED ORDER — INSULIN ASPART 100 UNIT/ML IV SOLN
10.0000 [IU] | Freq: Once | INTRAVENOUS | Status: AC
  Administered 2016-05-12: 10 [IU] via INTRAVENOUS
  Filled 2016-05-12: qty 0.1

## 2016-05-12 MED ORDER — INSULIN REGULAR HUMAN 100 UNIT/ML IJ SOLN
10.0000 [IU] | Freq: Once | INTRAMUSCULAR | Status: DC
Start: 2016-05-12 — End: 2016-05-12
  Filled 2016-05-12: qty 0.1

## 2016-05-12 MED ORDER — VITAMIN B-1 100 MG PO TABS: 100.0000 mg | ORAL_TABLET | Freq: Every day | ORAL | Status: DC

## 2016-05-12 MED ORDER — PIPERACILLIN-TAZOBACTAM 3.375 G IVPB 30 MIN
3.3750 g | Freq: Once | INTRAVENOUS | Status: AC
  Administered 2016-05-12: 3.375 g via INTRAVENOUS
  Filled 2016-05-12: qty 50

## 2016-05-12 MED ORDER — SODIUM POLYSTYRENE SULFONATE 15 GM/60ML PO SUSP
30.0000 g | Freq: Once | ORAL | Status: DC
  Filled 2016-05-12: qty 120

## 2016-05-12 MED ORDER — SODIUM CHLORIDE 0.9 % IV SOLN
1.0000 g | Freq: Once | INTRAVENOUS | Status: AC
  Administered 2016-05-12: 1 g via INTRAVENOUS
  Filled 2016-05-12: qty 10

## 2016-05-12 MED ORDER — ADULT MULTIVITAMIN W/MINERALS CH: 1.0000 | ORAL_TABLET | Freq: Every day | ORAL | Status: DC

## 2016-05-12 MED ORDER — IPRATROPIUM-ALBUTEROL 0.5-2.5 (3) MG/3ML IN SOLN: 3.0000 mL | Freq: Four times a day (QID) | RESPIRATORY_TRACT | Status: DC

## 2016-05-12 MED ORDER — BUDESONIDE 0.5 MG/2ML IN SUSP: 0.5000 mg | Freq: Two times a day (BID) | RESPIRATORY_TRACT | Status: DC

## 2016-05-13 LAB — BLOOD CULTURE ID PANEL (REFLEXED)
ACINETOBACTER BAUMANNII: NOT DETECTED
CANDIDA ALBICANS: NOT DETECTED
Candida glabrata: NOT DETECTED
Candida krusei: NOT DETECTED
Candida parapsilosis: NOT DETECTED
Candida tropicalis: NOT DETECTED
ENTEROBACTERIACEAE SPECIES: NOT DETECTED
Enterobacter cloacae complex: NOT DETECTED
Enterococcus species: NOT DETECTED
Escherichia coli: NOT DETECTED
HAEMOPHILUS INFLUENZAE: NOT DETECTED
Klebsiella oxytoca: NOT DETECTED
Klebsiella pneumoniae: NOT DETECTED
LISTERIA MONOCYTOGENES: NOT DETECTED
NEISSERIA MENINGITIDIS: NOT DETECTED
Proteus species: NOT DETECTED
Pseudomonas aeruginosa: NOT DETECTED
STREPTOCOCCUS AGALACTIAE: NOT DETECTED
STREPTOCOCCUS SPECIES: NOT DETECTED
Serratia marcescens: NOT DETECTED
Staphylococcus aureus (BCID): NOT DETECTED
Staphylococcus species: NOT DETECTED
Streptococcus pneumoniae: NOT DETECTED
Streptococcus pyogenes: NOT DETECTED

## 2016-05-13 LAB — GLUCOSE, CAPILLARY: Glucose-Capillary: 256 mg/dL — ABNORMAL HIGH (ref 65–99)

## 2016-05-13 LAB — T4, FREE: Free T4: 0.44 ng/dL — ABNORMAL LOW (ref 0.61–1.12)

## 2016-05-13 LAB — HIV ANTIBODY (ROUTINE TESTING W REFLEX): HIV Screen 4th Generation wRfx: NONREACTIVE

## 2016-05-14 LAB — BLOOD CULTURE ID PANEL (REFLEXED)
Acinetobacter baumannii: NOT DETECTED
CANDIDA PARAPSILOSIS: NOT DETECTED
Candida albicans: NOT DETECTED
Candida glabrata: NOT DETECTED
Candida krusei: NOT DETECTED
Candida tropicalis: NOT DETECTED
ENTEROBACTERIACEAE SPECIES: NOT DETECTED
ENTEROCOCCUS SPECIES: NOT DETECTED
ESCHERICHIA COLI: NOT DETECTED
Enterobacter cloacae complex: NOT DETECTED
HAEMOPHILUS INFLUENZAE: NOT DETECTED
KLEBSIELLA OXYTOCA: NOT DETECTED
Klebsiella pneumoniae: NOT DETECTED
LISTERIA MONOCYTOGENES: NOT DETECTED
Neisseria meningitidis: NOT DETECTED
PSEUDOMONAS AERUGINOSA: NOT DETECTED
Proteus species: NOT DETECTED
STREPTOCOCCUS PNEUMONIAE: NOT DETECTED
STREPTOCOCCUS PYOGENES: NOT DETECTED
STREPTOCOCCUS SPECIES: NOT DETECTED
Serratia marcescens: NOT DETECTED
Staphylococcus aureus (BCID): NOT DETECTED
Staphylococcus species: NOT DETECTED
Streptococcus agalactiae: NOT DETECTED

## 2016-05-14 LAB — URINE CULTURE: Culture: NO GROWTH

## 2016-05-15 LAB — CULTURE, BLOOD (ROUTINE X 2)

## 2016-05-25 NOTE — ED Notes (Signed)
Bed: WA15 Expected date:  Expected time:  Means of arrival:  Comments: EMS  

## 2016-05-25 NOTE — Progress Notes (Signed)
RN spoke with Elink md. MD spoke with sister who is the next of kin to pt. She decided to make pt DNI/DNR per MD. Will continue to monitor pt closely.

## 2016-05-25 NOTE — ED Provider Notes (Signed)
[facility]-EMERGENCY DEPT Provider Note   CSN: [id number] Arrival date & time: 2016-05-21  1350     History   Chief Complaint Chief Complaint  Patient presents with  . Altered Mental Status    HPI Taylor Fletcher is a 66 y.o. female.  Patient is a 66 year old female with a history of peripheral neuropathy but other history is unknown presenting today by EMS for altered mental status. Per EMS patient was found by a friend on the floor of her home. It is unclear how long she had been there. Patient was able to answer questions but appeared altered. She was hypoxic, cold and hypotensive and EMS arrived. They said she was not lying in urine or feces so felt she had not been there that long. Patient states she remembers falling yesterday. Per EMS she was able to move all extremities. Patient was immobilized in a c-collar cousin was unclear how she ended up on the floor.   The history is provided by the EMS personnel. The history is limited by the condition of the patient.  Altered Mental Status   This is a new problem. Episode onset: unknown.    Past Medical History:  Diagnosis Date  . Peripheral neuropathy (HCC)     There are no active problems to display for this patient.   History reviewed. No pertinent surgical history.  OB History    No data available       Home Medications    Prior to Admission medications   Medication Sig Start Date End Date Taking? Authorizing Provider  albuterol (PROVENTIL HFA;VENTOLIN HFA) 108 (90 Base) MCG/ACT inhaler Inhale 1-2 puffs into the lungs every 6 (six) hours as needed for wheezing or shortness of breath.   Yes Historical Provider, MD  ALPRAZolam Prudy Feeler) 1 MG tablet Take 1 mg by mouth 3 (three) times daily.   Yes Historical Provider, MD  diclofenac (VOLTAREN) 75 MG EC tablet Take 75 mg by mouth 2 (two) times daily as needed for mild pain.   Yes Historical Provider, MD  gabapentin (NEURONTIN) 300 MG capsule Take 600 mg by mouth every 8  (eight) hours.   Yes Historical Provider, MD  lisinopril (PRINIVIL,ZESTRIL) 40 MG tablet Take 40 mg by mouth daily.   Yes Historical Provider, MD  traMADol (ULTRAM) 50 MG tablet Take 50 mg by mouth every 4 (four) hours as needed for moderate pain or severe pain.   Yes Historical Provider, MD    Family History No family history on file.  Social History Social History  Substance Use Topics  . Smoking status: Current Every Day Smoker    Packs/day: 1.00    Types: Cigarettes  . Smokeless tobacco: Not on file  . Alcohol use Yes     Comment: beers     Allergies   Codeine   Review of Systems Review of Systems  Unable to perform ROS: Mental status change     Physical Exam Updated Vital Signs BP 113/83   Pulse 91   Temp (!) 90.3 F (32.4 C) (Core (Comment))   Resp (!) 31   Ht 5\' 4"  (1.626 m)   Wt 125 lb (56.7 kg)   SpO2 99%   BMI 21.46 kg/m   Physical Exam  Constitutional: She appears well-developed. She appears lethargic. She appears cachectic. She appears toxic.  HENT:  Head: Normocephalic and atraumatic.  Mouth/Throat: Mucous membranes are dry.  Thick white plaques over the tongue  Eyes: Conjunctivae and EOM are normal. Pupils are equal, round,  and reactive to light.  Neck: Neck supple.  Non-tender and c-spine intact  Cardiovascular: Intact distal pulses.  An irregular rhythm present. Tachycardia present.   No murmur heard. Pulmonary/Chest: Tachypnea noted. She has decreased breath sounds. She has no wheezes. She has rhonchi. She has no rales.  Abdominal: Soft. She exhibits no distension. There is no tenderness. There is no rebound and no guarding.  Musculoskeletal: She exhibits no edema or tenderness.  Fingers on bilateral hands are blue and cold.  4 sec cap refill.  Bilateral lower ext are mottled from the knee down.  Pulses not present in DP or radial but femoral pulse 1+  Neurological: She appears lethargic.  Moves all 4 ext and sensation appears to be intact    Skin: Skin is dry. No rash noted. No erythema.  Cool to the touch  Psychiatric:  Slowed response but able to answer questions  Nursing note and vitals reviewed.    ED Treatments / Results  Labs (all labs ordered are listed, but only abnormal results are displayed) Labs Reviewed  COMPREHENSIVE METABOLIC PANEL - Abnormal; Notable for the following:       Result Value   Sodium 127 (*)    Potassium 6.0 (*)    Chloride 96 (*)    CO2 13 (*)    Glucose, Bld 479 (*)    BUN 106 (*)    Creatinine, Ser 6.11 (*)    Calcium 7.7 (*)    Total Protein 6.2 (*)    Albumin 2.4 (*)    AST 80 (*)    GFR calc non Af Amer 6 (*)    GFR calc Af Amer 7 (*)    Anion gap 18 (*)    All other components within normal limits  CBC WITH DIFFERENTIAL/PLATELET - Abnormal; Notable for the following:    RBC 3.54 (*)    Hemoglobin 11.0 (*)    HCT 32.1 (*)    nRBC 11 (*)    All other components within normal limits  URINALYSIS, ROUTINE W REFLEX MICROSCOPIC - Abnormal; Notable for the following:    Color, Urine AMBER (*)    APPearance CLOUDY (*)    Protein, ur 30 (*)    Bacteria, UA FEW (*)    Squamous Epithelial / LPF 0-5 (*)    All other components within normal limits  BLOOD GAS, ARTERIAL - Abnormal; Notable for the following:    pH, Arterial 6.931 (*)    pO2, Arterial 78.1 (*)    Bicarbonate 9.1 (*)    Acid-base deficit 22.7 (*)    All other components within normal limits  RAPID URINE DRUG SCREEN, HOSP PERFORMED - Abnormal; Notable for the following:    Benzodiazepines POSITIVE (*)    Barbiturates POSITIVE (*)    All other components within normal limits  CK - Abnormal; Notable for the following:    Total CK 1,533 (*)    All other components within normal limits  CBG MONITORING, ED - Abnormal; Notable for the following:    Glucose-Capillary <10 (*)    All other components within normal limits  I-STAT CG4 LACTIC ACID, ED - Abnormal; Notable for the following:    Lactic Acid, Venous 4.31 (*)     All other components within normal limits  I-STAT TROPOININ, ED - Abnormal; Notable for the following:    Troponin i, poc 1.15 (*)    All other components within normal limits  CBG MONITORING, ED - Abnormal; Notable for the following:  Glucose-Capillary 118 (*)    All other components within normal limits  CBG MONITORING, ED - Abnormal; Notable for the following:    Glucose-Capillary 471 (*)    All other components within normal limits  CBG MONITORING, ED - Abnormal; Notable for the following:    Glucose-Capillary 316 (*)    All other components within normal limits  CULTURE, BLOOD (ROUTINE X 2)  CULTURE, BLOOD (ROUTINE X 2)  URINE CULTURE  LIPASE, BLOOD  ACETAMINOPHEN LEVEL  SALICYLATE LEVEL  ETHANOL    EKG  EKG Interpretation  Date/Time:  Friday 06-09-2016 14:10:38 EST Ventricular Rate:  111 PR Interval:    QRS Duration: 118 QT Interval:  314 QTC Calculation: 427 R Axis:   84 Text Interpretation:  Atrial fibrillation new Incomplete left bundle branch block Repol abnrm, severe global ischemia (LM/MVD) Confirmed by Anitra Lauth  MD, Evagelia Knack ([id number]) on 09-Jun-2016 2:23:04 PM       Radiology Dg Chest 1 View  Result Date: 09-Jun-2016 CLINICAL DATA:  Altered mental status. EXAM: CHEST 1 VIEW COMPARISON:  09/13/2005 FINDINGS: The heart size and pulmonary vascularity are normal. Lungs are clear. Aortic atherosclerosis. Old right posterolateral ninth and tenth rib fractures. IMPRESSION: No acute abnormality. Aortic atherosclerosis. Electronically Signed   By: Francene Boyers M.D.   On: June 09, 2016 14:38   Ct Head Wo Contrast  Result Date: June 09, 2016 CLINICAL DATA:  Slurred speech. Altered mental status. Peripheral neuropathy. EXAM: CT HEAD WITHOUT CONTRAST CT CERVICAL SPINE WITHOUT CONTRAST TECHNIQUE: Multidetector CT imaging of the head and cervical spine was performed following the standard protocol without intravenous contrast. Multiplanar CT image reconstructions of the  cervical spine were also generated. COMPARISON:  Taylor Fletcher. FINDINGS: CT HEAD FINDINGS Brain: The brainstem, cerebellum, cerebral peduncles, thalami, basal ganglia, basilar cisterns, and ventricular system appear within normal limits. No intracranial hemorrhage, mass lesion, or acute CVA. Vascular: There is atherosclerotic calcification of the cavernous carotid arteries bilaterally. Skull: Lucent lesion with some internal bony spiculations in the right sphenoid bone, 1.8 by 1.3 by 1.1 cm, mildly expansile, possibly a chondroid lesion such as enchondroma or Paget's disease. Sinuses/Orbits: Minimal chronic right posterior ethmoid sinusitis. Other: No supplemental non-categorized findings. CT CERVICAL SPINE FINDINGS Despite efforts by the technologist and patient, motion artifact is present on today's exam and could not be eliminated. This reduces exam sensitivity and specificity. Alignment: No vertebral subluxation is observed. There is some reversal the normal cervical lordosis. Skull base and vertebrae: Anterior spurring at the C1-2 articulation with loss of predental space. No fracture identified. Loss of disc height at C5-6 and C6-7 Soft tissues and spinal canal: Unremarkable Disc levels: Uncinate spurring at C5-6 and C6-7 but without bony impingement. Upper chest: Emphysema. Mild secondary pulmonary lobular septal thickening in the lung apices. Other: No supplemental non-categorized findings. IMPRESSION: 1. No acute intracranial findings. 2. Lucent expansile lesion with some internal bony linear densities in the left sphenoid bone just below the inferior orbital fissure, mildly expansile, possibilities include Paget's disease or chondroid lesion such as enchondroma. Given the small size and lack of extraosseous extension, into the such as chondrosarcoma are considered much less likely. No prior cross-sectional imaging through this region to assess stability. Although the appearance is not particularly aggressive, if  clinically warranted this lesion could be further assessed with dedicated MRI of the skull base with and without contrast. 3. Atherosclerosis. 4. Mild cervical spondylosis but without bony impingement identified. No acute cervical spine findings. 5. Emphysema with mild secondary pulmonary lobular septal thickening  in the lung apices. Electronically Signed   By: Gaylyn Rong M.D.   On: Jun 05, 2016 14:59   Ct Cervical Spine Wo Contrast  Result Date: 06/05/2016 CLINICAL DATA:  Slurred speech. Altered mental status. Peripheral neuropathy. EXAM: CT HEAD WITHOUT CONTRAST CT CERVICAL SPINE WITHOUT CONTRAST TECHNIQUE: Multidetector CT imaging of the head and cervical spine was performed following the standard protocol without intravenous contrast. Multiplanar CT image reconstructions of the cervical spine were also generated. COMPARISON:  Taylor Fletcher. FINDINGS: CT HEAD FINDINGS Brain: The brainstem, cerebellum, cerebral peduncles, thalami, basal ganglia, basilar cisterns, and ventricular system appear within normal limits. No intracranial hemorrhage, mass lesion, or acute CVA. Vascular: There is atherosclerotic calcification of the cavernous carotid arteries bilaterally. Skull: Lucent lesion with some internal bony spiculations in the right sphenoid bone, 1.8 by 1.3 by 1.1 cm, mildly expansile, possibly a chondroid lesion such as enchondroma or Paget's disease. Sinuses/Orbits: Minimal chronic right posterior ethmoid sinusitis. Other: No supplemental non-categorized findings. CT CERVICAL SPINE FINDINGS Despite efforts by the technologist and patient, motion artifact is present on today's exam and could not be eliminated. This reduces exam sensitivity and specificity. Alignment: No vertebral subluxation is observed. There is some reversal the normal cervical lordosis. Skull base and vertebrae: Anterior spurring at the C1-2 articulation with loss of predental space. No fracture identified. Loss of disc height at C5-6 and  C6-7 Soft tissues and spinal canal: Unremarkable Disc levels: Uncinate spurring at C5-6 and C6-7 but without bony impingement. Upper chest: Emphysema. Mild secondary pulmonary lobular septal thickening in the lung apices. Other: No supplemental non-categorized findings. IMPRESSION: 1. No acute intracranial findings. 2. Lucent expansile lesion with some internal bony linear densities in the left sphenoid bone just below the inferior orbital fissure, mildly expansile, possibilities include Paget's disease or chondroid lesion such as enchondroma. Given the small size and lack of extraosseous extension, into the such as chondrosarcoma are considered much less likely. No prior cross-sectional imaging through this region to assess stability. Although the appearance is not particularly aggressive, if clinically warranted this lesion could be further assessed with dedicated MRI of the skull base with and without contrast. 3. Atherosclerosis. 4. Mild cervical spondylosis but without bony impingement identified. No acute cervical spine findings. 5. Emphysema with mild secondary pulmonary lobular septal thickening in the lung apices. Electronically Signed   By: Gaylyn Rong M.D.   On: June 05, 2016 14:59    Procedures Procedures (including critical care time)  Medications Ordered in ED Medications  dextrose 10 % infusion ( Intravenous Not Given 05-Jun-2016 1505)  piperacillin-tazobactam (ZOSYN) IVPB 2.25 g (not administered)  dextrose 50 % solution (  Given 2016-06-05 1404)  sodium chloride 0.9 % bolus 1,000 mL (0 mLs Intravenous Stopped Jun 05, 2016 1511)  sodium chloride 0.9 % bolus 1,000 mL (0 mLs Intravenous Stopped 2016/06/05 1554)  piperacillin-tazobactam (ZOSYN) IVPB 3.375 g (0 g Intravenous Stopped 05-Jun-2016 1524)  vancomycin (VANCOCIN) IVPB 1000 mg/200 mL premix (1,000 mg Intravenous New Bag/Given Jun 05, 2016 1520)     Initial Impression / Assessment and Plan / ED Course  I have reviewed the triage vital signs and  the nursing notes.  Pertinent labs & imaging results that were available during my care of the patient were reviewed by me and considered in my medical decision making (see chart for details).    Patient is a 66 year old female with unknown medical history presenting today after being found down by a friend. When EMS arrived patient was hypoglycemic, hypotensive, hypoxic would still respond. Patient was given  2amps of D50 and 1 L of saline on route to the emergency department.  When patient's blood sugar was checked here was less than 10 and she was given a third amp of D50 and started on D10 drip at 125. His unclear if patient is diabetic. She was seen in the emergency room several years ago for alcohol intoxication. When the patient was asked if she was drinking alcohol she stated she does not remember. She may have fallen yesterday.  She has no evidence of trauma but is in a c-collar cousin is unclear how she ended up on the floor. O2 sats are reading low however it's unclear if that is accurate or due to poor perfusion. Patient's digits and lower extremities are purple blue. She is tachycardic in the 1 teens in hypothermic at 6493 which may be related to being on a cold floor and may be related to the hypoglycemia. After D50 patient's blood sugar improved to 118 and she was placed on the bear hugger. She she is starting to become more responsive. Code sepsis order set initiated in addition to altered mental status workup with EtOH, acetaminophen, salicylate levels and UDS.  Patient received 2 L of normal saline which appears to be 30/kg. Unable to weigh the patient at this time but feel she is around 125 pounds.  4:38 PM Patient's blood sugar remained stable without D10. Patient's lactate 4.3, blood gas significant for metabolic acidosis with a pH of 6.9, bicarbonate of 9 and CO2 of 45. Patient also found to have new acute renal failure with a creatinine of 6, potassium of 6 and CO2 of 13. Patient  received 30/kg bolus and continues to get normal saline. She is moaning but is protecting her airway at this time and does not appear to need intubation. CBC with normal white count but 20% bandemia and left shift. Also found to have mild rhabdomyolysis most likely from being on the floor for a prolonged period of time. UDS positive for benzos and barbiturates. She was covered with vancomycin and Zosyn. Also speaking with family patient drinks 12-24 cans of beer daily and his most likely not had anything to drink for the last 24 hours so concern for developing DTs. She will be admitted to critical care.  CRITICAL CARE Performed by: Gwyneth SproutPLUNKETT,Dannya Pitkin Total critical care time: 45 minutes Critical care time was exclusive of separately billable procedures and treating other patients. Critical care was necessary to treat or prevent imminent or life-threatening deterioration. Critical care was time spent personally by me on the following activities: development of treatment plan with patient and/or surrogate as well as nursing, discussions with consultants, evaluation of patient's response to treatment, examination of patient, obtaining history from patient or surrogate, ordering and performing treatments and interventions, ordering and review of laboratory studies, ordering and review of radiographic studies, pulse oximetry and re-evaluation of patient's condition.  Final Clinical Impressions(s) / ED Diagnoses   Final diagnoses:  Altered mental status, unspecified altered mental status type  Metabolic acidosis  Hypothermia, initial encounter  Acute renal failure, unspecified acute renal failure type [facility](HCC)    New Prescriptions New Prescriptions   No medications on file     Gwyneth SproutWhitney Porschia Willbanks, MD Sep 08, 2016 1645

## 2016-05-25 NOTE — Discharge Summary (Signed)
PULMONARY / CRITICAL CARE MEDICINE   Name: Taylor DinningBarbara B Fletcher MRN: [id number] DOB: 06/13/1950    ADMISSION DATE:  30-Dec-2016 CONSULTATION DATE:  02-10-2017  REFERRING MD:  Dr. Anitra LauthPlunkett  CHIEF COMPLAINT:  Multiple electrolytes abn/metabolic acidosis  HISTORY OF PRESENT ILLNESS:   Patient is encephalopathic so no information was obtained from her. Chart review was done as well as discussing the case with the ER.  Patient is a 66 year old female with a history of peripheral neuropathy but other history is unknown presenting today by EMS for altered mental status. Per EMS patient was found by a friend on the floor of her home. It is unclear how long she had been there. Patient was able to answer questions but appeared altered. She was hypoxic, cold and hypotensive and EMS arrived. They said she was not lying in urine or feces so felt she had not been there that long. Patient states she remembers falling yesterday.   The patient lives by herself. She had family see her 2 days ago and allegedly she was at baseline. Family visited her yesterday but there was no answer on the door.  Brother was concerned today as she was not answering her phone. He ended up calling EMS.  Her CBG was < 10 mg%.  She got 1 amp of D50-50. She was cold, clammy, mottled.  BP was 103/74, heart rate ranging from 100- 120. Temperature was 93.73F.  She received 2 L warm saline bolus as well as BairHugger. Her subsequently CBG increased to 400 mg% then 150 mg% (blood) after hydration and 1 amp of D50.   Pt was protecting her airway.  By the time I saw her, she was mumbling and groaning.  K was 6, BUN 106, Creat 6.11, HCO3 13. ABG 6.93, 46, 78 on NRBM.   PAST MEDICAL HISTORY :  She  has a past medical history of History of ETOH abuse; Hypertension; Peripheral neuropathy (HCC); Pneumonia; and Restless legs syndrome.  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  Limited as patient is altered.   PAST SURGICAL HISTORY: She  has a past  surgical history that includes Abdominal hysterectomy.  Limited as patient is altered.  Allergies  Allergen Reactions  . Codeine   . Other     Dr. Elmyra RicksKim's admission notes under 10/31/10 state pt has questionable allergy to Darvocet. Pt unable to talk and confirm.    No current facility-administered medications on file prior to encounter.    No current outpatient prescriptions on file prior to encounter.    FAMILY HISTORY:  Her has no family status information on file.  No information available.  SOCIAL HISTORY: She  reports that she has been smoking Cigarettes.  She has been smoking about 1.00 pack per day. She does not have any smokeless tobacco history on file. She reports that she drinks alcohol. She reports that she does not use drugs.  REVIEW OF SYSTEMS:   Limited as patient is altered.  SUBJECTIVE:  As above.   VITAL SIGNS: BP (!) 61/25   Pulse 85   Temp 97 F (36.1 C)   Resp (!) 0   Ht 5\' 4"  (1.626 m)   Wt 56.7 kg (125 lb)   SpO2 100%   BMI 21.46 kg/m   HEMODYNAMICS:    VENTILATOR SETTINGS:    INTAKE / OUTPUT: No intake/output data recorded.  PHYSICAL EXAMINATION: General:  Chronically ill, disheveled, unkempt, groaning, moaning, protecting her airway. Without any particular complaints.  Neuro:  CN grossly intact.  No lateralizing signs elicited. HEENT:  PERLA, (-) NVD. Flat neck veins.  (-) oral thrush Cardiovascular:  Good s1/s2. (-) s3/m/r/g Lungs:  Fair ae. CTA. (-) accesory muscle use. Abdomen:  Dec BS, soft, NT. (-) masses.  Musculoskeletal:  Mottled. Initially she was cyanotic and blue but that has improved. Clammy distal extremities. Very dry.  Skin:  Cool and clammy and mottled. (-) rash/clubbing  LABS:  BMET  Recent Labs Lab 2016/06/06 1452  NA 127*  K 6.0*  CL 96*  CO2 13*  BUN 106*  CREATININE 6.11*  GLUCOSE 479*    Electrolytes  Recent Labs Lab June 06, 2016 1452  CALCIUM 7.7*    CBC  Recent Labs Lab 06-06-2016 1452  Jun 06, 2016 1931  WBC 5.7 2.7*  HGB 11.0* 5.2*  HCT 32.1* 14.9*  PLT 221 91*    Coag's  Recent Labs Lab 06-06-2016 1931  APTT 40*  INR 1.31    Sepsis Markers  Recent Labs Lab 06/06/2016 1502  LATICACIDVEN 4.31*    ABG  Recent Labs Lab 06/06/16 1525 06-Jun-2016 1940  PHART 6.931* 6.876*  PCO2ART 45.6 77.6*  PO2ART 78.1* 40.6*    Liver Enzymes  Recent Labs Lab 06-06-2016 1452  AST 80*  ALT 27  ALKPHOS 60  BILITOT 0.7  ALBUMIN 2.4*    Cardiac Enzymes No results for input(s): TROPONINI, PROBNP in the last 168 hours.  Glucose  Recent Labs Lab 06-06-16 1355 06-06-16 1413 Jun 06, 2016 1452 06/06/2016 1531 Jun 06, 2016 1656 06/06/2016 1852  GLUCAP <10* 118* 471* 316* 227* 256*    Imaging No results found.   STUDIES:  Echo 1/19 >   CULTURES: MRSA 1/19 > Blood 1/19 > Urine 1/19 >  ANTIBIOTICS: Vanc 1/19 > Zosyn 1/19 >   SIGNIFICANT EVENTS: 1/19 admitted for altered mental status, metabolic acidosis, hypothermia, lactic acidosis.  LINES/TUBES:   DISCUSSION: 73F, known to drink ETOH, found down and unresponsive. CBG was < 10 mg%. Now with lactic acidosis/metabolic acidosis/AKI/Rhabdomyolysis.   ASSESSMENT / PLAN:  PULMONARY A: Acute hypoxemic respiratory failure likely secondary to underlying metabolic issues/hypothermia/lactic acidosis/demand ischemia/possible sepsis Possible COPD given smoking history, not in exacerbation.  No PNA based on CXR P:   Keep on NRBM.  Protecting her airway for now. At risk for intubation. Keep o2 sats > 88-90%. Pulmicort BID and Duoneb QID.    CARDIOVASCULAR A:  Demand ischemia R/O CAD P:  Trend troponin Check echo  RENAL A:   AKI 2/2 Hypovolemia/severe dehydration/Rhabdomyolyisis Hyperkalemia Hyponatremia Metabolic acidosis / lactic acidosis P:   S/P 2 L warm saline.  I think patient is still volume depleted. We'll give 2 L saline bolus. After 4 L, need to reassess fluid status. Start bicarbonate  drip.  Give kayexalate, calcium gluconate. Insulin, D50, Na bicarbonate for hyper K. Check BMP q6 >> if K does not improve, may need HD or CVVH.   GASTROINTESTINAL A:   Likely with gastritis given ETOH abuse P:   PPI IV Trend CBC.  NPO for now Check LFTs  HEMATOLOGIC A:   No issues P:  Observe CBC  INFECTIOUS A:   No clear source of infection.  CXR w/o infiltrate (could be dry). U/A not too overwhelming for infection. Sepsis being considered.  P:   Check blood culture, urine culture.  Vanc and zosyn pending cultures.  Trend lactic acid.   ENDOCRINE A:   Hypoglycemia but quickly responded.  P:   Rpt cbg 150-400 mg%. Observe CBG  NEUROLOGIC A:   Acute encephalopathy. Multifactorial : 2/2  electrolytes abn, hypothermia, lactic acidosis, etoh withdrawal, drug OD (with barbiturates and benzos in UDS).  P:  Check ETOH, acetaminophen and salicylate levels, ammonia, TSH, free T4, HIV. Start Precedex for possible etoh withdrawal.  benzos prn.  CIWA protocol  RASS goal: 0 - -1    FAMILY  - Updates: No family at bedside. Patient is divorced. She has a sister, Darl Pikes, who lives in [place]. I updated Darl Pikes on patient's condition. Patient's only family in [place] is her cousin Zollie Beckers. I updated Zollie Beckers as well. I told Darl Pikes and Zollie Beckers to talk and discuss patient's goals of care. I mentioned that she is critically ill and may end up being on the ventilator. They are undecided at this point. Plan to continue patient as a  full code. Patient's sister will be flying in from [place] tomorrow morning.  - Inter-disciplinary family meet or Palliative Care meeting due by:  1/25.    I spent  35  minutes of Critical Care time with this patient today.    Pollie Meyer, MD Pulmonary and Critical Care Medicine [facility] Pager: [phone] After 3 pm or if no response, call [phone]  05/15/2016, 9:30 AM    Addendum : The patient was transferred from the ER to  the ICU. By the time she got to the ICU, her respirations were agonal and she was more mottled and cyanotic. [facility] physician, Dr. Isaiah Serge, updated family. Her sister and cousin decided to make patient DO NOT RESUSCITATE and no escalation of care. She was subsequently made comfort care. She was pronounced dead on 06/11/16 at 09-30-18. Family was at bedside.  Pollie Meyer, MD 05/15/2016, 9:32 AM [facility] Pager [phone] After 3 pm or if no answer, call [phone]

## 2016-05-25 NOTE — ED Triage Notes (Addendum)
Per EMS pt called by friend that verbalizes pt did not answer phone. Pt slurred speech and CBG 54; given 2 amps of D50 and 1000 ml of normal saline en route with EMS. Pt alert and oriented to self.

## 2016-05-25 NOTE — Accreditation Note (Signed)
o Restraints not reported to CMS Pursuant to regulation 482.13 (G) (3) use of soft wrist restraints was logged on 01.23.2018 at 0629 by Rosine DoorAngus Asia Favata, RN, Patient safety and Accreditation.

## 2016-05-25 NOTE — Progress Notes (Addendum)
eLink Physician-Brief Progress Note Patient Name: Taylor DinningBarbara B Fletcher DOB: 08/26/1950 MRN: [id number]   Date of Service  2017-03-23  HPI/Events of Note  New patient eval. See H& P for more details Pt mottled, cyanotic, agonal, unresponsive. O2 sats unreadable  I called her sister Taylor Fletcher in [place] and cousin Taylor Fletcher. The family is clear that they would not want her to be on life support if there is no reasonable chance of recovery. I have told them that given the profound metabolic abnormalities it is unlikely that she would recover even with vent, pressor and HD support.   eICU Interventions  The family has elected to make her DNR/DNI. No aggressive measures.  We will continue bicarb drip, supplemental O2 and antibiotics for now. Comfort care if she deteriorates further.  Family is aware that she will likely pass away tonight.        Teniola Tseng 2017-03-23, 7:00 PM

## 2016-05-25 NOTE — Progress Notes (Signed)
Pharmacy Antibiotic Note  Taylor Fletcher is a 66 y.o. female admitted on 18-Mar-2017 with sepsis.  Patient with AMS, hypoxic, hypoglycemic, and hypotensive when EMS arrived at her home. In ED, patient hypothermic and tachycardic. Pharmacy has been consulted for Vancomycin and Zosyn dosing.  Plan: -Vancomycin 1g IV x 1 given in the ED.  -Given AKI, no further Vancomycin doses will be ordered at this time. Will plan on dosing Vancomycin per levels. -Zosyn 3.375g IV x 1 over 30 minutes given in the ED. Continue with Zosyn 2.25g IV q8h. -Monitor renal function, cultures, clinical course.   Height: 5\' 4"  (162.6 cm) Weight: 125 lb (56.7 kg) IBW/kg (Calculated) : 54.7  Temp (24hrs), Avg:91.7 F (33.2 C), Min:90.3 F (32.4 C), Max:93.1 F (33.9 C)   Recent Labs Lab 06/21/16 1452 06/21/16 1502  WBC 5.7  --   CREATININE 6.11*  --   LATICACIDVEN  --  4.31*    Estimated Creatinine Clearance: 7.7 mL/min (by C-G formula based on SCr of 6.11 mg/dL (H)).    Allergies  Allergen Reactions  . Codeine   . Other     Dr. Elmyra RicksKim's admission notes under 10/31/10 state pt has questionable allergy to Darvocet. Pt unable to talk and confirm.    Antimicrobials this admission: 1/19 >> Vancomycin >> 1/19 >> Zosyn >>  Dose adjustments this admission: ---  Microbiology results: 1/19 BCx: sent 1/19 UCx: sent    Thank you for allowing pharmacy to be a part of this patient's care.   Greer PickerelJigna Latravious Levitt, PharmD, BCPS Pager: [phone] 18-Mar-2017 3:52 PM

## 2016-05-25 NOTE — Progress Notes (Signed)
Bi-lateral soft wrist restraints removed upon arrival to the unit.

## 2016-05-25 NOTE — Progress Notes (Addendum)
   Dec 01, 2016 0000  CM Assessment  Expected Discharge Plan Home w Home Health Services  In-house Referral NA  Discharge Planning Services CM Consult  [facility]  Status of Service In process, will continue to follow  Discharge Disposition Home w Home Health Services    ED CM noted pt without pcp listed and CM consult to follow for possible d/c needs Pt lives alone as confirmed by female friends who confirms pt has a sister in [place] and that pt does not have any active home health services Pt with AMS and CM unable to provide information to her at this time They provided number to admission RN to enter.  While CM present in pt room ED RN reports a call from sister in [place] for update.  Pt medication bottles brought in indicates [facility]cp as [facility] updated

## 2016-05-25 NOTE — Progress Notes (Signed)
Pt time of death 2120, verified by Kevan Nyheryl Denny, RN, and myself. No heart sounds auscultated, no evidence of respirations. Notified family and next of kin.

## 2016-05-25 NOTE — H&P (Signed)
PULMONARY / CRITICAL CARE MEDICINE   Name: Taylor Fletcher MRN: [id number] DOB: 1950-06-29    ADMISSION DATE:  2016-05-31 CONSULTATION DATE:  2016-05-31  REFERRING MD:  Dr. Anitra Lauth  CHIEF COMPLAINT:  Multiple electrolytes abn/metabolic acidosis  HISTORY OF PRESENT ILLNESS:   Patient is encephalopathic so no information was obtained from her. Chart review was done as well as discussing the case with the ER.  Patient is a 66 year old female with a history of peripheral neuropathy but other history is unknown presenting today by EMS for altered mental status. Per EMS patient was found by a friend on the floor of her home. It is unclear how long she had been there. Patient was able to answer questions but appeared altered. She was hypoxic, cold and hypotensive and EMS arrived. They said she was not lying in urine or feces so felt she had not been there that long. Patient states she remembers falling yesterday.   The patient lives by herself. She had family see her 2 days ago and allegedly she was at baseline. Family visited her yesterday but there was no answer on the door.  Brother was concerned today as she was not answering her phone. He ended up calling EMS.  Her CBG was < 10 mg%.  She got 1 amp of D50-50. She was cold, clammy, mottled.  BP was 103/74, heart rate ranging from 100- 120. Temperature was 93.34F.  She received 2 L warm saline bolus as well as BairHugger. Her subsequently CBG increased to 400 mg% then 150 mg% (blood) after hydration and 1 amp of D50.   Pt was protecting her airway.  By the time I saw her, she was mumbling and groaning.  K was 6, BUN 106, Creat 6.11, HCO3 13. ABG 6.93, 46, 78 on NRBM.   PAST MEDICAL HISTORY :  She  has a past medical history of History of ETOH abuse; Hypertension; Peripheral neuropathy (HCC); Pneumonia; and Restless legs syndrome.  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  Limited as patient is altered.   PAST SURGICAL HISTORY: She  has a past  surgical history that includes Abdominal hysterectomy.  Limited as patient is altered.  Allergies  Allergen Reactions  . Codeine   . Other     Dr. Elmyra Ricks admission notes under 10/31/10 state pt has questionable allergy to Darvocet. Pt unable to talk and confirm.    No current facility-administered medications on file prior to encounter.    No current outpatient prescriptions on file prior to encounter.    FAMILY HISTORY:  Her has no family status information on file.  No information available.  SOCIAL HISTORY: She  reports that she has been smoking Cigarettes.  She has been smoking about 1.00 pack per day. She does not have any smokeless tobacco history on file. She reports that she drinks alcohol. She reports that she does not use drugs.  REVIEW OF SYSTEMS:   Limited as patient is altered.  SUBJECTIVE:  As above.   VITAL SIGNS: BP 113/83   Pulse 91   Temp (!) 90.3 F (32.4 C) (Core (Comment))   Resp (!) 31   Ht 5\' 4"  (1.626 m)   Wt 56.7 kg (125 lb)   SpO2 99%   BMI 21.46 kg/m   HEMODYNAMICS:    VENTILATOR SETTINGS:    INTAKE / OUTPUT: No intake/output data recorded.  PHYSICAL EXAMINATION: General:  Chronically ill, disheveled, unkempt, groaning, moaning, protecting her airway. Without any particular complaints.  Neuro:  CN  grossly intact. No lateralizing signs elicited. HEENT:  PERLA, (-) NVD. Flat neck veins.  (-) oral thrush Cardiovascular:  Good s1/s2. (-) s3/m/r/g Lungs:  Fair ae. CTA. (-) accesory muscle use. Abdomen:  Dec BS, soft, NT. (-) masses.  Musculoskeletal:  Mottled. Initially she was cyanotic and blue but that has improved. Clammy distal extremities. Very dry.  Skin:  Cool and clammy and mottled. (-) rash/clubbing  LABS:  BMET  Recent Labs Lab 2016/06/01 1452  NA 127*  K 6.0*  CL 96*  CO2 13*  BUN 106*  CREATININE 6.11*  GLUCOSE 479*    Electrolytes  Recent Labs Lab 06/01/2016 1452  CALCIUM 7.7*    CBC  Recent Labs Lab  Jun 01, 2016 1452  WBC 5.7  HGB 11.0*  HCT 32.1*  PLT 221    Coag's No results for input(s): APTT, INR in the last 168 hours.  Sepsis Markers  Recent Labs Lab 2016/06/01 1502  LATICACIDVEN 4.31*    ABG  Recent Labs Lab 06/01/16 1525  PHART 6.931*  PCO2ART 45.6  PO2ART 78.1*    Liver Enzymes  Recent Labs Lab 2016/06/01 1452  AST 80*  ALT 27  ALKPHOS 60  BILITOT 0.7  ALBUMIN 2.4*    Cardiac Enzymes No results for input(s): TROPONINI, PROBNP in the last 168 hours.  Glucose  Recent Labs Lab 06-01-16 1355 06/01/2016 1413 06-01-16 1452 06/01/2016 1531 06-01-16 1656  GLUCAP <10* 118* 471* 316* 227*    Imaging Dg Chest 1 View  Result Date: 2016-06-01 CLINICAL DATA:  Altered mental status. EXAM: CHEST 1 VIEW COMPARISON:  09/13/2005 FINDINGS: The heart size and pulmonary vascularity are normal. Lungs are clear. Aortic atherosclerosis. Old right posterolateral ninth and tenth rib fractures. IMPRESSION: No acute abnormality. Aortic atherosclerosis. Electronically Signed   By: Francene Boyers M.D.   On: June 01, 2016 14:38   Ct Head Wo Contrast  Result Date: 2016-06-01 CLINICAL DATA:  Slurred speech. Altered mental status. Peripheral neuropathy. EXAM: CT HEAD WITHOUT CONTRAST CT CERVICAL SPINE WITHOUT CONTRAST TECHNIQUE: Multidetector CT imaging of the head and cervical spine was performed following the standard protocol without intravenous contrast. Multiplanar CT image reconstructions of the cervical spine were also generated. COMPARISON:  None. FINDINGS: CT HEAD FINDINGS Brain: The brainstem, cerebellum, cerebral peduncles, thalami, basal ganglia, basilar cisterns, and ventricular system appear within normal limits. No intracranial hemorrhage, mass lesion, or acute CVA. Vascular: There is atherosclerotic calcification of the cavernous carotid arteries bilaterally. Skull: Lucent lesion with some internal bony spiculations in the right sphenoid bone, 1.8 by 1.3 by 1.1 cm, mildly  expansile, possibly a chondroid lesion such as enchondroma or Paget's disease. Sinuses/Orbits: Minimal chronic right posterior ethmoid sinusitis. Other: No supplemental non-categorized findings. CT CERVICAL SPINE FINDINGS Despite efforts by the technologist and patient, motion artifact is present on today's exam and could not be eliminated. This reduces exam sensitivity and specificity. Alignment: No vertebral subluxation is observed. There is some reversal the normal cervical lordosis. Skull base and vertebrae: Anterior spurring at the C1-2 articulation with loss of predental space. No fracture identified. Loss of disc height at C5-6 and C6-7 Soft tissues and spinal canal: Unremarkable Disc levels: Uncinate spurring at C5-6 and C6-7 but without bony impingement. Upper chest: Emphysema. Mild secondary pulmonary lobular septal thickening in the lung apices. Other: No supplemental non-categorized findings. IMPRESSION: 1. No acute intracranial findings. 2. Lucent expansile lesion with some internal bony linear densities in the left sphenoid bone just below the inferior orbital fissure, mildly expansile, possibilities include Paget's  disease or chondroid lesion such as enchondroma. Given the small size and lack of extraosseous extension, into the such as chondrosarcoma are considered much less likely. No prior cross-sectional imaging through this region to assess stability. Although the appearance is not particularly aggressive, if clinically warranted this lesion could be further assessed with dedicated MRI of the skull base with and without contrast. 3. Atherosclerosis. 4. Mild cervical spondylosis but without bony impingement identified. No acute cervical spine findings. 5. Emphysema with mild secondary pulmonary lobular septal thickening in the lung apices. Electronically Signed   By: Gaylyn Rong M.D.   On: 2016-05-16 14:59   Ct Cervical Spine Wo Contrast  Result Date: 05/16/2016 CLINICAL DATA:  Slurred  speech. Altered mental status. Peripheral neuropathy. EXAM: CT HEAD WITHOUT CONTRAST CT CERVICAL SPINE WITHOUT CONTRAST TECHNIQUE: Multidetector CT imaging of the head and cervical spine was performed following the standard protocol without intravenous contrast. Multiplanar CT image reconstructions of the cervical spine were also generated. COMPARISON:  None. FINDINGS: CT HEAD FINDINGS Brain: The brainstem, cerebellum, cerebral peduncles, thalami, basal ganglia, basilar cisterns, and ventricular system appear within normal limits. No intracranial hemorrhage, mass lesion, or acute CVA. Vascular: There is atherosclerotic calcification of the cavernous carotid arteries bilaterally. Skull: Lucent lesion with some internal bony spiculations in the right sphenoid bone, 1.8 by 1.3 by 1.1 cm, mildly expansile, possibly a chondroid lesion such as enchondroma or Paget's disease. Sinuses/Orbits: Minimal chronic right posterior ethmoid sinusitis. Other: No supplemental non-categorized findings. CT CERVICAL SPINE FINDINGS Despite efforts by the technologist and patient, motion artifact is present on today's exam and could not be eliminated. This reduces exam sensitivity and specificity. Alignment: No vertebral subluxation is observed. There is some reversal the normal cervical lordosis. Skull base and vertebrae: Anterior spurring at the C1-2 articulation with loss of predental space. No fracture identified. Loss of disc height at C5-6 and C6-7 Soft tissues and spinal canal: Unremarkable Disc levels: Uncinate spurring at C5-6 and C6-7 but without bony impingement. Upper chest: Emphysema. Mild secondary pulmonary lobular septal thickening in the lung apices. Other: No supplemental non-categorized findings. IMPRESSION: 1. No acute intracranial findings. 2. Lucent expansile lesion with some internal bony linear densities in the left sphenoid bone just below the inferior orbital fissure, mildly expansile, possibilities include  Paget's disease or chondroid lesion such as enchondroma. Given the small size and lack of extraosseous extension, into the such as chondrosarcoma are considered much less likely. No prior cross-sectional imaging through this region to assess stability. Although the appearance is not particularly aggressive, if clinically warranted this lesion could be further assessed with dedicated MRI of the skull base with and without contrast. 3. Atherosclerosis. 4. Mild cervical spondylosis but without bony impingement identified. No acute cervical spine findings. 5. Emphysema with mild secondary pulmonary lobular septal thickening in the lung apices. Electronically Signed   By: Gaylyn Rong M.D.   On: 05-16-2016 14:59     STUDIES:  Echo 1/19 >   CULTURES: MRSA 1/19 > Blood 1/19 > Urine 1/19 >  ANTIBIOTICS: Vanc 1/19 > Zosyn 1/19 >   SIGNIFICANT EVENTS: 1/19 admitted for altered mental status, metabolic acidosis, hypothermia, lactic acidosis.  LINES/TUBES:   DISCUSSION: 27F, known to drink ETOH, found down and unresponsive. CBG was < 10 mg%. Now with lactic acidosis/metabolic acidosis/AKI/Rhabdomyolysis.   ASSESSMENT / PLAN:  PULMONARY A: Acute hypoxemic respiratory failure likely secondary to underlying metabolic issues/hypothermia/lactic acidosis/demand ischemia/possible sepsis Possible COPD given smoking history, not in exacerbation.  No PNA based  on CXR P:   Keep on NRBM.  Protecting her airway for now. At risk for intubation. Keep o2 sats > 88-90%. Pulmicort BID and Duoneb QID.    CARDIOVASCULAR A:  Demand ischemia R/O CAD P:  Trend troponin Check echo  RENAL A:   AKI 2/2 Hypovolemia/severe dehydration/Rhabdomyolyisis Hyperkalemia Hyponatremia Metabolic acidosis / lactic acidosis P:   S/P 2 L warm saline.  I think patient is still volume depleted. We'll give 2 L saline bolus. After 4 L, need to reassess fluid status. Start bicarbonate drip.  Give kayexalate,  calcium gluconate. Insulin, D50, Na bicarbonate for hyper K. Check BMP q6 >> if K does not improve, may need HD or CVVH.   GASTROINTESTINAL A:   Likely with gastritis given ETOH abuse P:   PPI IV Trend CBC.  NPO for now Check LFTs  HEMATOLOGIC A:   No issues P:  Observe CBC  INFECTIOUS A:   No clear source of infection.  CXR w/o infiltrate (could be dry). U/A not too overwhelming for infection. Sepsis being considered.  P:   Check blood culture, urine culture.  Vanc and zosyn pending cultures.  Trend lactic acid.   ENDOCRINE A:   Hypoglycemia but quickly responded.  P:   Rpt cbg 150-400 mg%. Observe CBG  NEUROLOGIC A:   Acute encephalopathy. Multifactorial : 2/2 electrolytes abn, hypothermia, lactic acidosis, etoh withdrawal, drug OD (with barbiturates and benzos in UDS).  P:  Check ETOH, acetaminophen and salicylate levels, ammonia, TSH, free T4, HIV. Start Precedex for possible etoh withdrawal.  benzos prn.  CIWA protocol  RASS goal: 0 - -1    FAMILY  - Updates: No family at bedside. Patient is divorced. She has a sister, Darl PikesSusan, who lives in [place]. I updated Darl PikesSusan on patient's condition. Patient's only family in [place] is her cousin Zollie BeckersWalter. I updated Zollie BeckersWalter as well. I told Darl PikesSusan and Zollie BeckersWalter to talk and discuss patient's goals of care. I mentioned that she is critically ill and may end up being on the ventilator. They are undecided at this point. Plan to continue patient as a  full code. Patient's sister will be flying in from [place] tomorrow morning.  - Inter-disciplinary family meet or Palliative Care meeting due by:  1/25.    I spent  35  minutes of Critical Care time with this patient today.    Pollie MeyerJ. Angelo A de Dios, MD Pulmonary and Critical Care Medicine [facility] Pager: [phone] After 3 pm or if no response, call [phone]  2016/10/21, 5:15 PM

## 2016-05-25 DEATH — deceased

## 2018-07-09 IMAGING — CT CT CERVICAL SPINE W/O CM
3 of 7 series · 11 of 33 positions shown, 12 images · non-contrast
Comparison: None.

CLINICAL DATA: Slurred speech. Altered mental status. Peripheral
neuropathy.

EXAM:
CT HEAD WITHOUT CONTRAST
CT CERVICAL SPINE WITHOUT CONTRAST
TECHNIQUE: Multidetector CT imaging of the head and cervical spine was
performed following the standard protocol without intravenous
contrast. Multiplanar CT image reconstructions of the cervical spine
were also generated.

[Series 6: coronal · coronal · 0.27mm/px · 3 of 64 slices shown]
[im 13/64  bone]
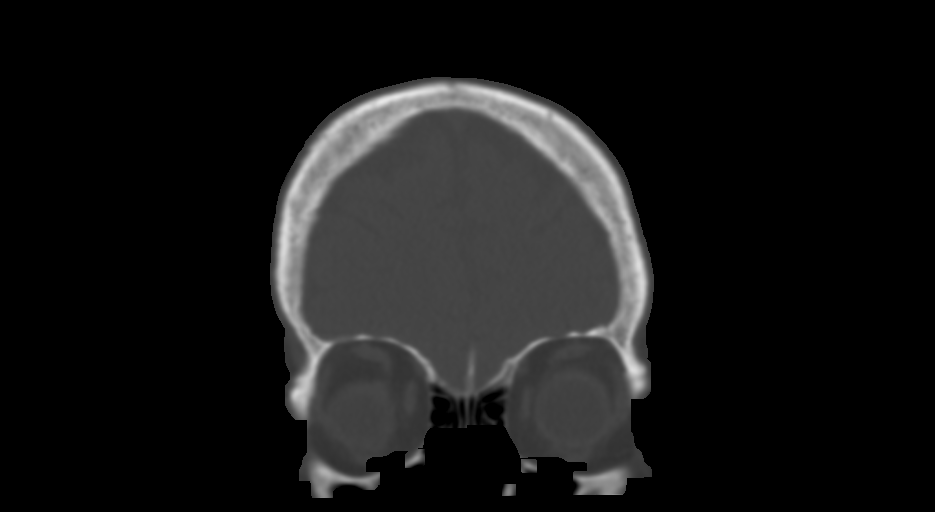
[im 26/64  bone]
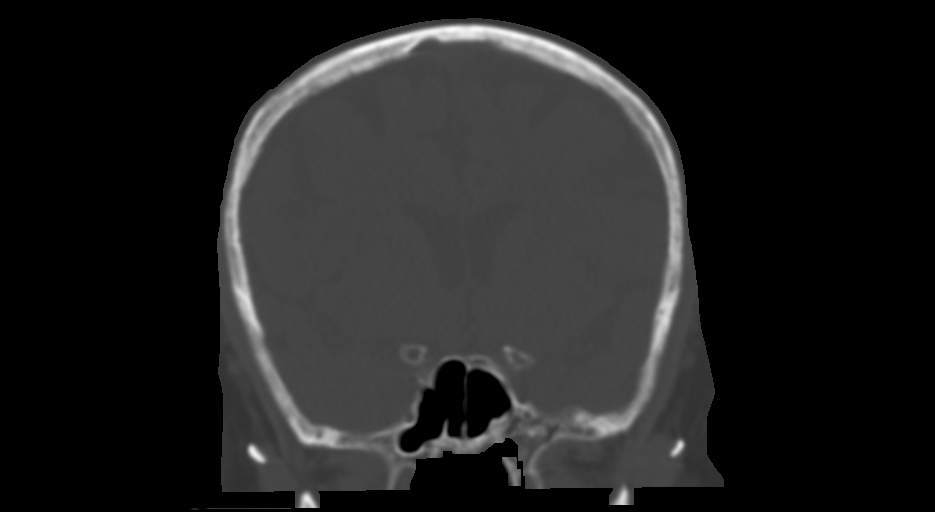
[im 38/64  bone]
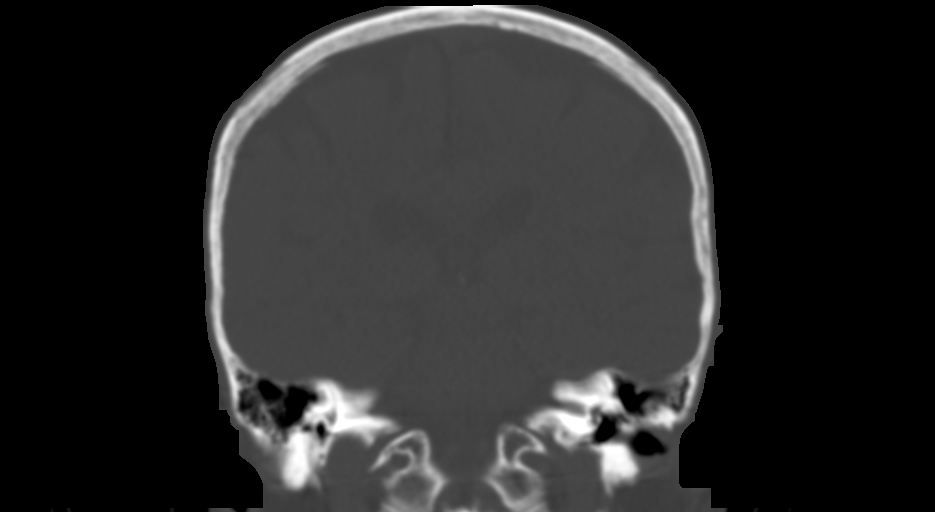

[Series 7: sagittal · sagittal · 0.27mm/px · 5 of 53 slices shown]
[im 8/53  bone]
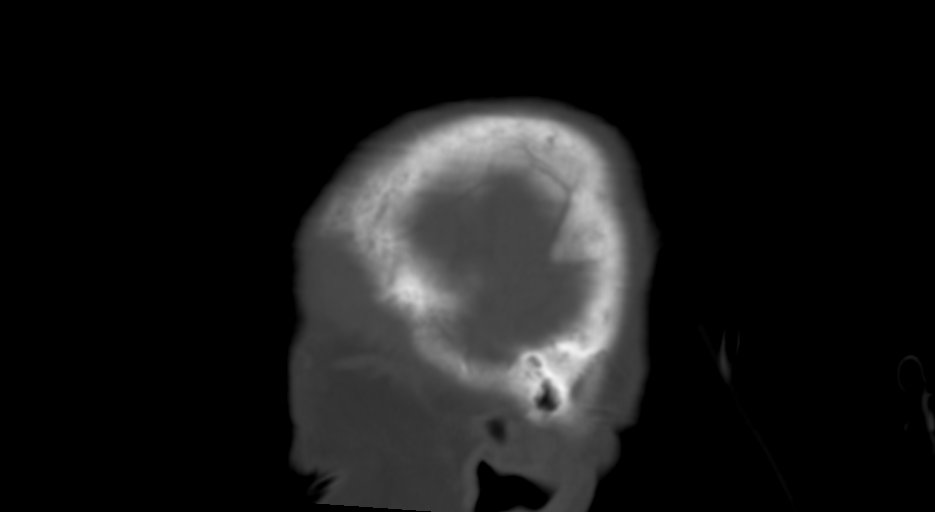
[im 15/53  bone]
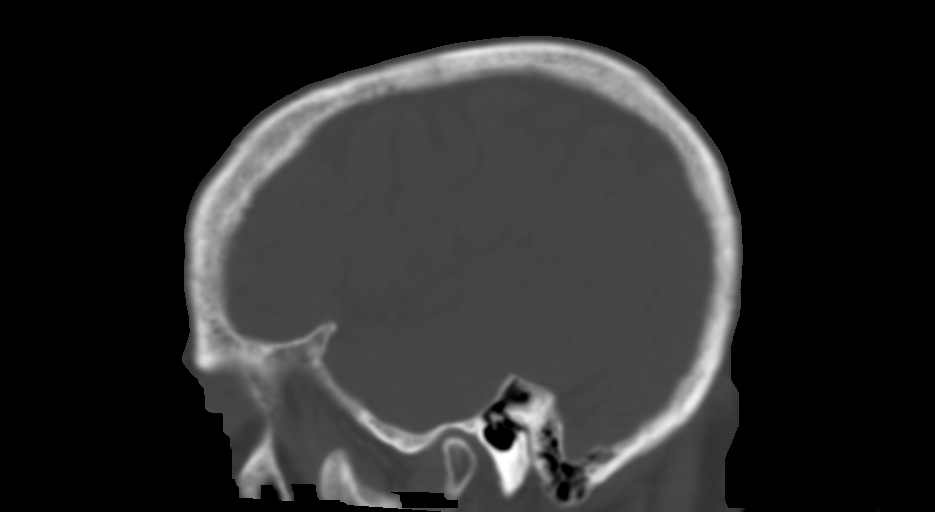
[im 23/53  bone]
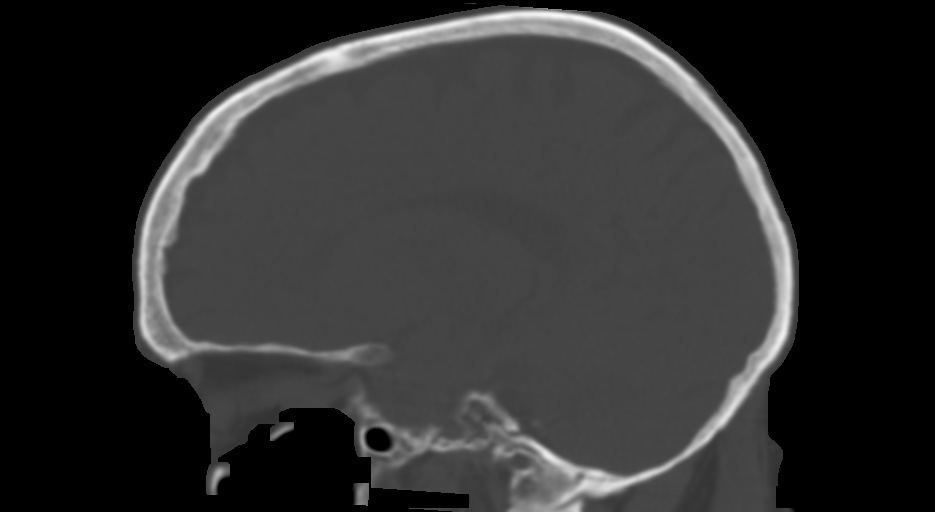
[im 30/53  bone]
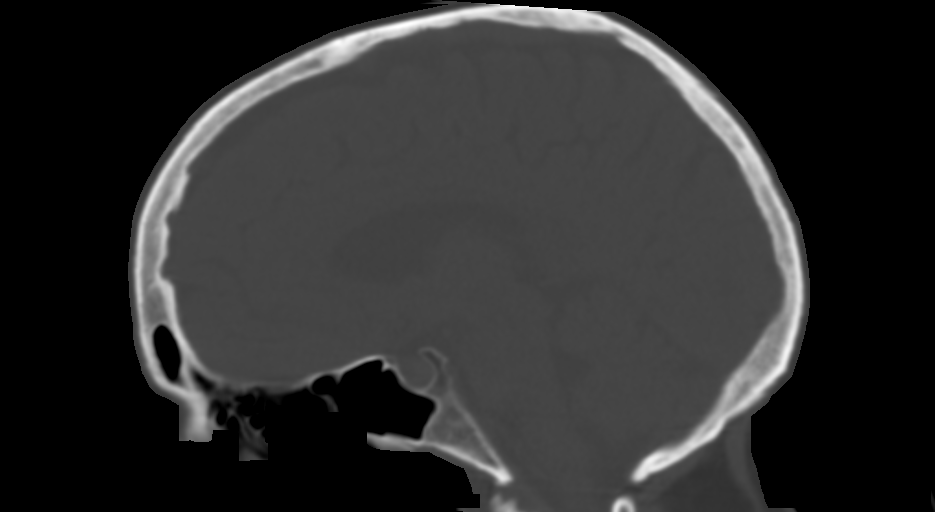
[im 38/53  bone]
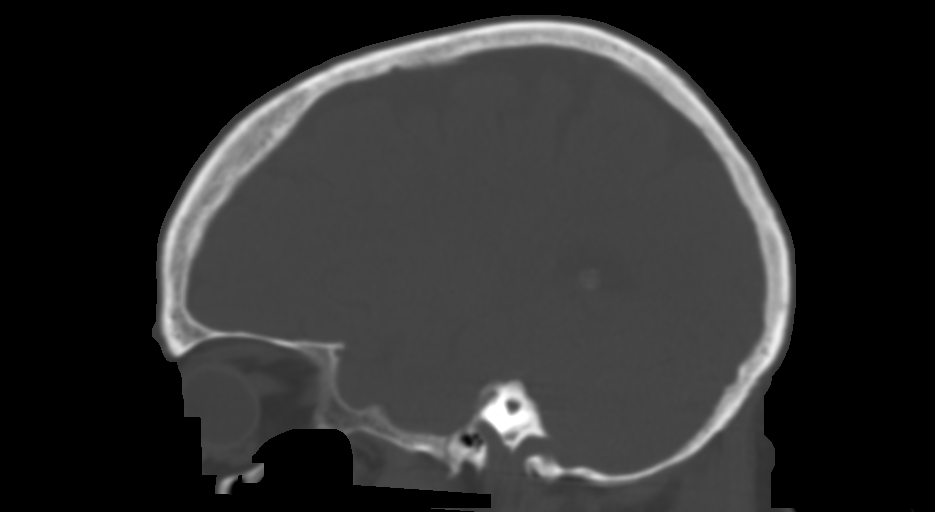

[Series 602: axial reformats · axial · 0.35mm/px · z∈[+1332,+1438]mm · 3 of 109 slices shown, 4 images]
[im 22/109  soft-tissue]
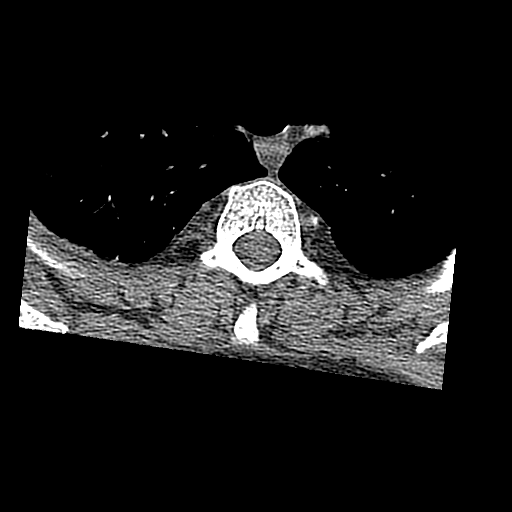
[im 22/109  bone]
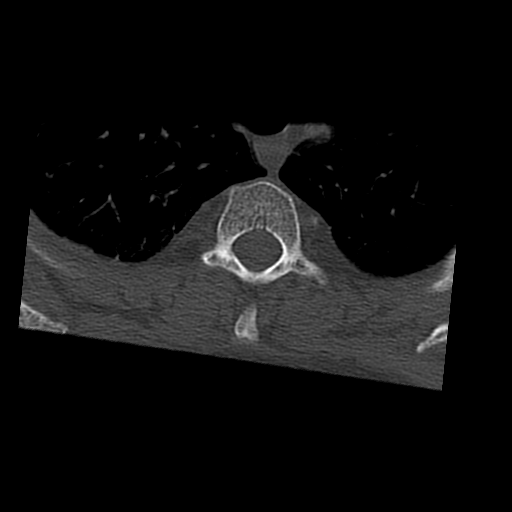
[im 65/109  bone]
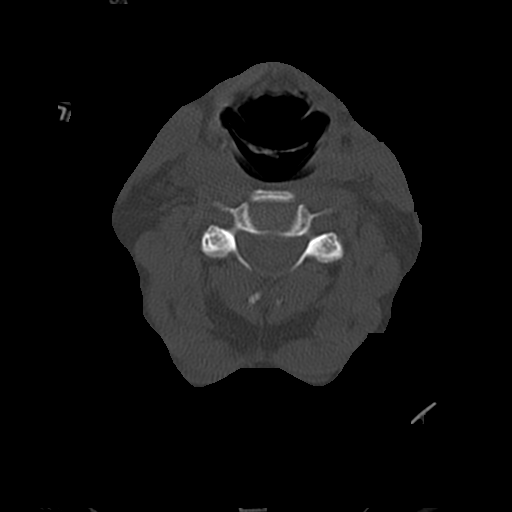
[im 87/109  bone]
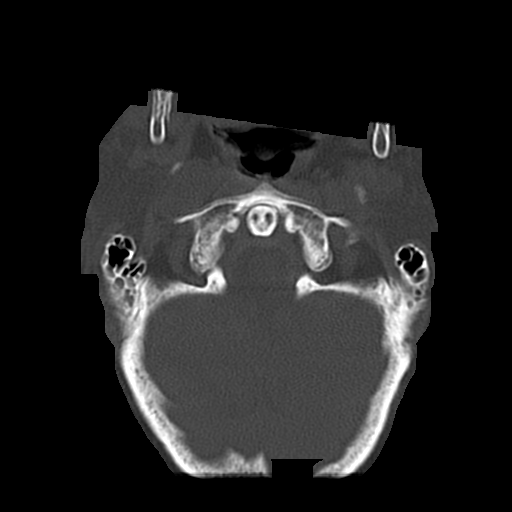

[11 of 33 positions shown; findings below may reference images not displayed]

FINDINGS: CT HEAD FINDINGS

Brain: The brainstem, cerebellum, cerebral peduncles, thalami, basal
ganglia, basilar cisterns, and ventricular system appear within
normal limits. No intracranial hemorrhage, mass lesion, or acute
CVA.

Vascular: There is atherosclerotic calcification of the cavernous
carotid arteries bilaterally.

Skull: Lucent lesion with some internal bony spiculations in the
right sphenoid bone, 1.8 by 1.3 by 1.1 cm, mildly expansile,
possibly a chondroid lesion such as enchondroma or Paget's disease.

Sinuses/Orbits: Minimal chronic right posterior ethmoid sinusitis.

Other: No supplemental non-categorized findings.

CT CERVICAL SPINE FINDINGS

Despite efforts by the technologist and patient, motion artifact is
present on today's exam and could not be eliminated. This reduces
exam sensitivity and specificity.

Alignment: No vertebral subluxation is observed. There is some
reversal the normal cervical lordosis.

Skull base and vertebrae: Anterior spurring at the C1-2 articulation
with loss of predental space. No fracture identified. Loss of disc
height at C5-6 and C6-7

Soft tissues and spinal canal: Unremarkable

Disc levels: Uncinate spurring at C5-6 and C6-7 but without bony
impingement.

Upper chest: Emphysema. Mild secondary pulmonary lobular septal
thickening in the lung apices.

Other: No supplemental non-categorized findings.
IMPRESSION: 1. No acute intracranial findings.
2. Lucent expansile lesion with some internal bony linear densities
in the left sphenoid bone just below the inferior orbital fissure,
mildly expansile, possibilities include Paget's disease or chondroid
lesion such as enchondroma. Given the small size and lack of
extraosseous extension, into the such as chondrosarcoma are
considered much less likely. No prior cross-sectional imaging
through this region to assess stability. Although the appearance is
not particularly aggressive, if clinically warranted this lesion
could be further assessed with dedicated MRI of the skull base with
and without contrast.
3. Atherosclerosis.
4. Mild cervical spondylosis but without bony impingement
identified. No acute cervical spine findings.
5. Emphysema with mild secondary pulmonary lobular septal thickening
in the lung apices.
